# Patient Record
Sex: Male | Born: 1955 | Race: White | Hispanic: No | Smoking: Former smoker
Health system: Southern US, Community
[De-identification: ages and names within clinical notes are randomized; demographics above are authoritative.]

## PROBLEM LIST (undated history)

## (undated) DIAGNOSIS — I2510 Atherosclerotic heart disease of native coronary artery without angina pectoris: Secondary | ICD-10-CM

## (undated) DIAGNOSIS — I358 Other nonrheumatic aortic valve disorders: Secondary | ICD-10-CM

## (undated) DIAGNOSIS — Z87891 Personal history of nicotine dependence: Secondary | ICD-10-CM

## (undated) DIAGNOSIS — B1920 Unspecified viral hepatitis C without hepatic coma: Secondary | ICD-10-CM

## (undated) DIAGNOSIS — F419 Anxiety disorder, unspecified: Secondary | ICD-10-CM

## (undated) DIAGNOSIS — D013 Carcinoma in situ of anus and anal canal: Secondary | ICD-10-CM

## (undated) DIAGNOSIS — I10 Essential (primary) hypertension: Secondary | ICD-10-CM

## (undated) DIAGNOSIS — K6282 Dysplasia of anus: Secondary | ICD-10-CM

## (undated) DIAGNOSIS — M1990 Unspecified osteoarthritis, unspecified site: Secondary | ICD-10-CM

## (undated) DIAGNOSIS — Z21 Asymptomatic human immunodeficiency virus [HIV] infection status: Secondary | ICD-10-CM

## (undated) DIAGNOSIS — B20 Human immunodeficiency virus [HIV] disease: Secondary | ICD-10-CM

## (undated) DIAGNOSIS — L409 Psoriasis, unspecified: Secondary | ICD-10-CM

## (undated) DIAGNOSIS — N433 Hydrocele, unspecified: Secondary | ICD-10-CM

## (undated) HISTORY — DX: Hydrocele, unspecified: N43.3

## (undated) HISTORY — DX: Atherosclerotic heart disease of native coronary artery without angina pectoris: I25.10

## (undated) HISTORY — DX: Essential (primary) hypertension: I10

## (undated) HISTORY — DX: Dysplasia of anus: K62.82

## (undated) HISTORY — DX: Human immunodeficiency virus (HIV) disease: B20

## (undated) HISTORY — DX: Asymptomatic human immunodeficiency virus (hiv) infection status: Z21

## (undated) HISTORY — DX: Carcinoma in situ of anus and anal canal: D01.3

## (undated) HISTORY — DX: Anxiety disorder, unspecified: F41.9

## (undated) HISTORY — PX: TESTICULAR EXPLORATION: SHX5145

## (undated) HISTORY — DX: Personal history of nicotine dependence: Z87.891

## (undated) HISTORY — DX: Other nonrheumatic aortic valve disorders: I35.8

## (undated) HISTORY — DX: Unspecified viral hepatitis C without hepatic coma: B19.20

## (undated) HISTORY — DX: Psoriasis, unspecified: L40.9

## (undated) HISTORY — DX: Unspecified osteoarthritis, unspecified site: M19.90

## (undated) HISTORY — PX: CARDIAC CATHETERIZATION: SHX172

---
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
## 1999-12-08 DIAGNOSIS — I2510 Atherosclerotic heart disease of native coronary artery without angina pectoris: Secondary | ICD-10-CM

## 1999-12-08 HISTORY — DX: Atherosclerotic heart disease of native coronary artery without angina pectoris: I25.10

## 2000-08-21 ENCOUNTER — Encounter: Payer: Self-pay | Admitting: Emergency Medicine

## 2000-08-21 ENCOUNTER — Inpatient Hospital Stay (HOSPITAL_COMMUNITY): Admission: EM | Admit: 2000-08-21 | Discharge: 2000-08-23 | Payer: Self-pay | Admitting: Emergency Medicine

## 2000-08-22 ENCOUNTER — Encounter: Payer: Self-pay | Admitting: Cardiovascular Disease

## 2001-05-29 ENCOUNTER — Emergency Department (HOSPITAL_COMMUNITY): Admission: EM | Admit: 2001-05-29 | Discharge: 2001-05-29 | Payer: Self-pay | Admitting: Emergency Medicine

## 2001-09-16 ENCOUNTER — Ambulatory Visit (HOSPITAL_COMMUNITY): Admission: RE | Admit: 2001-09-16 | Discharge: 2001-09-16 | Payer: Self-pay | Admitting: Gastroenterology

## 2003-03-15 ENCOUNTER — Encounter: Payer: Self-pay | Admitting: Emergency Medicine

## 2003-03-15 ENCOUNTER — Emergency Department (HOSPITAL_COMMUNITY): Admission: EM | Admit: 2003-03-15 | Discharge: 2003-03-15 | Payer: Self-pay | Admitting: Emergency Medicine

## 2005-08-12 ENCOUNTER — Inpatient Hospital Stay (HOSPITAL_COMMUNITY): Admission: AD | Admit: 2005-08-12 | Discharge: 2005-08-15 | Payer: Self-pay | Admitting: Orthopaedic Surgery

## 2005-08-12 ENCOUNTER — Ambulatory Visit: Payer: Self-pay | Admitting: Infectious Diseases

## 2012-08-07 DIAGNOSIS — I358 Other nonrheumatic aortic valve disorders: Secondary | ICD-10-CM

## 2012-08-07 HISTORY — DX: Other nonrheumatic aortic valve disorders: I35.8

## 2013-07-04 ENCOUNTER — Other Ambulatory Visit: Payer: Self-pay | Admitting: *Deleted

## 2013-07-04 MED ORDER — METOPROLOL SUCCINATE ER 25 MG PO TB24: 25.0000 mg | ORAL_TABLET | Freq: Every day | ORAL | Status: DC

## 2013-07-04 NOTE — Telephone Encounter (Signed)
Rx was sent to pharmacy electronically. 

## 2013-07-24 ENCOUNTER — Telehealth: Payer: Self-pay | Admitting: Cardiovascular Disease

## 2013-07-24 MED ORDER — HYDROCHLOROTHIAZIDE 25 MG PO TABS: 12.5000 mg | ORAL_TABLET | Freq: Every day | ORAL | Status: DC

## 2013-07-24 NOTE — Telephone Encounter (Signed)
Returned call.  No answer.  No message left r/t a business voicemail.  Will refill one 90-day supply with (0) zero refills.  Pt must keep appt on 9.30.14.

## 2013-07-24 NOTE — Telephone Encounter (Signed)
Patient needs new rx called to [facility] [phone] for HCTZ 25mg  # 90  1/2 tab daily.

## 2013-08-18 ENCOUNTER — Ambulatory Visit: Payer: Self-pay | Admitting: Cardiovascular Disease

## 2013-08-31 ENCOUNTER — Encounter: Payer: Self-pay | Admitting: Cardiology

## 2013-08-31 DIAGNOSIS — I2510 Atherosclerotic heart disease of native coronary artery without angina pectoris: Secondary | ICD-10-CM

## 2013-08-31 DIAGNOSIS — I358 Other nonrheumatic aortic valve disorders: Secondary | ICD-10-CM | POA: Insufficient documentation

## 2013-08-31 DIAGNOSIS — Z87891 Personal history of nicotine dependence: Secondary | ICD-10-CM | POA: Insufficient documentation

## 2013-08-31 DIAGNOSIS — I10 Essential (primary) hypertension: Secondary | ICD-10-CM | POA: Insufficient documentation

## 2013-09-04 ENCOUNTER — Encounter: Payer: Self-pay | Admitting: Cardiovascular Disease

## 2013-09-05 ENCOUNTER — Encounter: Payer: Self-pay | Admitting: Cardiovascular Disease

## 2013-09-05 ENCOUNTER — Ambulatory Visit (INDEPENDENT_AMBULATORY_CARE_PROVIDER_SITE_OTHER): Payer: BC Managed Care – PPO | Admitting: Cardiovascular Disease

## 2013-09-05 VITALS — BP 112/80 | HR 61 | Ht 73.0 in | Wt 203.2 lb

## 2013-09-05 DIAGNOSIS — I2510 Atherosclerotic heart disease of native coronary artery without angina pectoris: Secondary | ICD-10-CM

## 2013-09-05 DIAGNOSIS — I10 Essential (primary) hypertension: Secondary | ICD-10-CM

## 2013-09-05 NOTE — Progress Notes (Signed)
Patient ID: Ronald Avery, male   DOB: 05-02-1956, 57 y.o.   MRN: [id number]     HPI: Ronald Avery, is a 57 y.o. male who presents for one-year cardiology evaluation.  Ronald Avery has a history of hypertension. I first saw him in 2001 when he presented with stage II hypertension. He has been on combination therapy since that time. Because of recurrent episodes of chest tightness worrisome for angina I did perform cardiac catheterization on 08/23/2000 which revealed 30-40% mid RCA narrowing and 20% ostial stenosis involving the PDA branch of the right artery artery. He has been treated medically. His last nuclear perfusion study was in August 2008 which continued to show normal perfusion.  He has been on combination therapy for hypertension. He has had mildly elevated cholesterol levels in the in the past but these have significantly improved with aggressive diet and exercise.  Over the past year, he lost his job. He is now back in school getting advanced training he presents for one-year followup evaluation.  Past Medical History  Diagnosis Date  . HTN (hypertension)   . CAD (coronary artery disease) 2001    minor- 30-40% RCA  . History of smoking   . Aortic valve sclerosis 9/13    Past Surgical History  Procedure Laterality Date  . Knee arthroscopy  1973  . Testicular exploration  1882    cyst    No Known Allergies  Current Outpatient Prescriptions  Medication Sig Dispense Refill  . amLODipine (NORVASC) 5 MG tablet Take 5 mg by mouth daily.      Marland Kitchen aspirin 81 MG tablet Take 81 mg by mouth daily.      . hydrochlorothiazide (HYDRODIURIL) 25 MG tablet Take 0.5 tablets (12.5 mg total) by mouth daily. Please keep appt for refills  45 tablet  0  . metoprolol succinate (TOPROL XL) 25 MG 24 hr tablet Take 1 tablet (25 mg total) by mouth daily.  90 tablet  0  . ramipril (ALTACE) 10 MG capsule Take 10 mg by mouth daily.       No current facility-administered medications for  this visit.    History   Social History  . Marital Status: Divorced    Spouse Name: N/A    Number of Children: N/A  . Years of Education: N/A   Occupational History  . Not on file.   Social History Main Topics  . Smoking status: Former Games developer  . Smokeless tobacco: Not on file  . Alcohol Use: No  . Drug Use: Not on file  . Sexual Activity: Not on file   Other Topics Concern  . Not on file   Social History Narrative  . No narrative on file    Family History  Problem Relation Age of Onset  . Cancer Mother 12    Socially he is divorced and has 2 children. Remote tobacco history but he quit many years ago. Does drink occasional alcohol. He does exercise.   ROS is negative for fevers, chills or night sweats. He denies visual symptoms. He denies palpitations. He denies chest pressure. He denies wheezing. Denies abdominal pain. There is no bleeding. He denies claudication. There is no edema.  Other system review is negative.  PE BP 112/80  Pulse 61  Ht 6\' 1"  (1.854 m)  Wt 203 lb 3.2 oz (92.171 kg)  BMI 26.81 kg/m2  Repeat blood pressure 128/78. General: Alert, oriented, no distress.  Skin: normal turgor, no rashes HEENT: Normocephalic, atraumatic. Pupils round and  reactive; sclera anicteric;no lid lag.  Nose without nasal septal hypertrophy Mouth/Parynx benign; Mallinpatti scale 2 Neck: No JVD, no carotid briuts Lungs: clear to ausculatation and percussion; no wheezing or rales Heart: RRR, s1 s2 normal  No S3 gallop; no rub. Whiff of a systolic murmur Abdomen: soft, nontender; no hepatosplenomehaly, BS+; abdominal aorta nontender and not dilated by palpation. Pulses 2+ Extremities: no clubbing cyanosis or edema, Homan's sign negative  Neurologic: grossly nonfocal  ECG: Normal sinus rhythm at 61 beats per minute. Very mild first-degree AV block with PR 208 ms. Normal QTc interval.  LABS:  BMET No results found for this basename: na, k, cl, co2, glucose, bun,  creatinine, calcium, gfrnonaa, gfraa     Hepatic Function Panel  No results found for this basename: prot, albumin, ast, alt, alkphos, bilitot, bilidir, ibili     CBC No results found for this basename: wbc, rbc, hgb, hct, plt, mcv, mch, mchc, rdw, neutrabs, lymphsabs, monoabs, eosabs, basosabs     BNP No results found for this basename: probnp    Lipid Panel  No results found for this basename: chol, trig, hdl, cholhdl, vldl, ldlcalc     RADIOLOGY: No results found.    ASSESSMENT AND PLAN: Clinically, [name] continues to do well. He was wondering some of his medications can be reduced. Presently, he has no signs of edema. I recommended we discontinue the hydrochlorothiazide. He'll monitor his blood pressure with goal ideally to be in the 120s systolically. His blood pressure consistently gets above 140 this may need to be reinstituted. He tells me he recently had lab work done several months ago his previous work. We will try to get the results from these from lab core. Adjustments were made as necessary to medical regimen per I did encourage continued exercise. Body mass index today is 26.8. His rhythm is stable. His blood pressure is well controlled. I will see him in one year for followup cardiology evaluation or sooner if problems arise.     Lennette Bihari, MD, [facility]  09/05/2013 9:16 AM

## 2013-09-05 NOTE — Patient Instructions (Addendum)
Your physician recommends that you schedule a follow-up appointment in 1 YEAR.  

## 2013-11-01 ENCOUNTER — Telehealth: Payer: Self-pay | Admitting: Cardiovascular Disease

## 2013-11-01 MED ORDER — METOPROLOL SUCCINATE ER 25 MG PO TB24: 25.0000 mg | ORAL_TABLET | Freq: Every day | ORAL | Status: DC

## 2013-11-01 MED ORDER — AMLODIPINE BESYLATE 5 MG PO TABS: 5.0000 mg | ORAL_TABLET | Freq: Every day | ORAL | Status: DC

## 2013-11-01 MED ORDER — RAMIPRIL 10 MG PO CAPS: 10.0000 mg | ORAL_CAPSULE | Freq: Every day | ORAL | Status: DC

## 2013-11-01 NOTE — Telephone Encounter (Signed)
Returned patient's call. Informed samples of requested medications not available. Patient in between insurance companies and would like a 7 day supply sent to local pharmacy. Medications refilled at [facility] per patient.

## 2013-11-01 NOTE — Telephone Encounter (Signed)
Would like to know if we have samples of Toprol,Ramipril and Amlodipine please.

## 2013-11-08 ENCOUNTER — Other Ambulatory Visit: Payer: Self-pay | Admitting: *Deleted

## 2013-11-08 MED ORDER — METOPROLOL SUCCINATE ER 25 MG PO TB24: 25.0000 mg | ORAL_TABLET | Freq: Every day | ORAL | Status: DC

## 2013-11-08 NOTE — Telephone Encounter (Signed)
Rx was sent to pharmacy electronically. 

## 2014-02-20 ENCOUNTER — Telehealth: Payer: Self-pay | Admitting: Cardiovascular Disease

## 2014-02-20 MED ORDER — AMLODIPINE BESYLATE 5 MG PO TABS: 5.0000 mg | ORAL_TABLET | Freq: Every day | ORAL | Status: DC

## 2014-02-20 MED ORDER — RAMIPRIL 10 MG PO CAPS: 10.0000 mg | ORAL_CAPSULE | Freq: Every day | ORAL | Status: DC

## 2014-02-20 NOTE — Telephone Encounter (Signed)
Refills sent to pharmacy. 

## 2014-02-20 NOTE — Telephone Encounter (Signed)
He need new prescriptions for a new pharmacy. He need Ramapril 10 mg #90 and Amlodipine 5 mg #90. Please call to [facility]-[phone].

## 2014-09-06 ENCOUNTER — Telehealth: Payer: Self-pay | Admitting: Cardiovascular Disease

## 2014-09-06 MED ORDER — AMLODIPINE BESYLATE 5 MG PO TABS: 5.0000 mg | ORAL_TABLET | Freq: Every day | ORAL | Status: DC

## 2014-09-06 NOTE — Telephone Encounter (Signed)
Rx was sent to pharmacy electronically. 

## 2014-09-06 NOTE — Telephone Encounter (Signed)
Pt need a new prescription for his Amlodipine 5 mg #90 please. Please call to [facility].

## 2014-09-24 ENCOUNTER — Ambulatory Visit (INDEPENDENT_AMBULATORY_CARE_PROVIDER_SITE_OTHER): Payer: BC Managed Care – PPO | Admitting: Cardiovascular Disease

## 2014-09-24 ENCOUNTER — Encounter: Payer: Self-pay | Admitting: Cardiovascular Disease

## 2014-09-24 VITALS — BP 172/98 | HR 73 | Ht 73.0 in | Wt 197.8 lb

## 2014-09-24 DIAGNOSIS — I359 Nonrheumatic aortic valve disorder, unspecified: Secondary | ICD-10-CM

## 2014-09-24 DIAGNOSIS — I2510 Atherosclerotic heart disease of native coronary artery without angina pectoris: Secondary | ICD-10-CM

## 2014-09-24 DIAGNOSIS — R5383 Other fatigue: Secondary | ICD-10-CM

## 2014-09-24 DIAGNOSIS — I10 Essential (primary) hypertension: Secondary | ICD-10-CM

## 2014-09-24 DIAGNOSIS — I358 Other nonrheumatic aortic valve disorders: Secondary | ICD-10-CM

## 2014-09-24 DIAGNOSIS — B20 Human immunodeficiency virus [HIV] disease: Secondary | ICD-10-CM | POA: Insufficient documentation

## 2014-09-24 MED ORDER — AMLODIPINE BESYLATE 10 MG PO TABS: 10.0000 mg | ORAL_TABLET | Freq: Every day | ORAL | Status: DC

## 2014-09-24 MED ORDER — LISINOPRIL 5 MG PO TABS: 5.0000 mg | ORAL_TABLET | Freq: Every day | ORAL | Status: DC

## 2014-09-24 MED ORDER — LISINOPRIL 10 MG PO TABS: 10.0000 mg | ORAL_TABLET | Freq: Every day | ORAL | Status: DC

## 2014-09-24 NOTE — Patient Instructions (Signed)
Your physician has recommended you make the following change in your medication: increase the amlodipine to 10 mg daily. Start new prescription for lisinopril as directed on the bottle. These prescriptions have already been sent to the pharmacy.  Your physician recommends that you return for lab work fasting.  Your physician has requested that you have an echocardiogram. Echocardiography is a painless test that uses sound waves to create images of your heart. It provides your doctor with information about the size and shape of your heart and how well your heart's chambers and valves are working. This procedure takes approximately one hour. There are no restrictions for this procedure.  Your physician recommends that you schedule a follow-up appointment in: 4-6 weeks.

## 2014-09-24 NOTE — Progress Notes (Signed)
Patient ID: Ronald Avery, male   DOB: [birth year], 58 y.o.   MRN: [id number]     HPI: Ronald Avery is a 58 y.o. male who presents for one-year cardiology evaluation.  Ronald Avery has a history of hypertension. I first saw him in 2001 when he presented with stage II hypertension. He has been on combination therapy since that time. Because of recurrent episodes of chest tightness worrisome for angina I  performed cardiac catheterization on 08/23/2000 which revealed 30-40% mid RCA narrowing and 20% ostial stenosis involving the PDA branch of the right artery artery. He has been treated medically. His last nuclear perfusion study was in August 2008 which continued to show normal perfusion.  He has been on combination therapy for hypertension. He has had mildly elevated cholesterol levels in the in the past but these have significantly improved with aggressive diet and exercise.  When I saw him one year ago, he was on amlodipine 5 mg, metoprolol XL 25 mg, Ramapo 10 mg, and hydrochlorothiazide 12.5 mg daily for blood pressure control.  His blood pressure was stable.  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  He tells me that several months ago.  He was more fatigued.  At that time he decided to stop 3 of his blood pressure medications and, consequently, he's only been taking amlodipine 5 mg along with his aspirin and Atripla.  He stopped hydrochlorothiazide, Toprol-XL, and Ramapo.  He is back at school, getting a Masters degree in sociology and still working part-time.  He has not been exercising as regularly as he had in the past.  He presents for evaluation.   Past Medical History  Diagnosis Date  . HTN (hypertension)   . CAD (coronary artery disease) 2001    minor- 30-40% RCA  . History of smoking   . Aortic valve sclerosis 9/13    Past Surgical History  Procedure  Laterality Date  . Knee arthroscopy  1973  . Testicular exploration  1882    cyst    No Known Allergies  Current Outpatient Prescriptions  Medication Sig Dispense Refill  . aspirin 81 MG tablet Take 81 mg by mouth daily.      . ATRIPLA 600-200-300 MG per tablet       . amLODipine (NORVASC) 10 MG tablet Take 1 tablet (10 mg total) by mouth daily.  180 tablet  3  . lisinopril (PRINIVIL,ZESTRIL) 10 MG tablet Take 1 tablet (10 mg total) by mouth daily.  90 tablet  3  . lisinopril (PRINIVIL,ZESTRIL) 5 MG tablet Take 1 tablet (5 mg total) by mouth daily.  7 tablet  0   No current facility-administered medications for this visit.    History   Social History  . Marital Status: Divorced    Spouse Name: N/A    Number of Children: N/A  . Years of Education: N/A   Occupational History  . Not on file.   Social History Main Topics  . Smoking status: Former Research scientist (life sciences)  . Smokeless tobacco: Not on file  . Alcohol Use: No  . Drug Use: Not on file  . Sexual Activity: Not on file   Other Topics Concern  . Not on file   Social History Narrative  . No narrative on file    Family History  Problem Relation Age of Onset  . Cancer Mother 45  Socially he is divorced and has 2 children. Remote tobacco history but he quit many years ago. Does drink occasional alcohol. He does exercise.   ROS General: Negative; No fevers, chills, or night sweats; positive for fatigue HEENT: Negative; No changes in vision or hearing, sinus congestion, difficulty swallowing Pulmonary: Negative; No cough, wheezing, shortness of breath, hemoptysis Cardiovascular: Negative; No chest pain, presyncope, syncope, palpitations GI: Negative; No nausea, vomiting, diarrhea, or abdominal pain GU: Negative; No dysuria, hematuria, or difficulty voiding Musculoskeletal: Negative; no myalgias, joint pain, or weakness Hematologic/Oncology: Positive for HIV; no easy bruising, bleeding Endocrine: Negative; no heat/cold  intolerance; no diabetes Neuro: Negative; no changes in balance, headaches Skin: Negative; No rashes or skin lesions Psychiatric: Negative; No behavioral problems, depression Sleep: Negative; No snoring, daytime sleepiness, hypersomnolence, bruxism, restless legs, hypnogognic hallucinations, no cataplexy Other comprehensive 14 point system review is negative.  PE BP 172/98  Pulse 73  Ht 6\' 1"  (1.854 m)  Wt 197 lb 12.8 oz (89.721 kg)  BMI 26.10 kg/m2  Repeat blood pressure by me was 170/100 General: Alert, oriented, no distress.  Skin: normal turgor, no rashes HEENT: Normocephalic, atraumatic. Pupils round and reactive; sclera anicteric;no lid lag.  Nose without nasal septal hypertrophy Mouth/Parynx benign; Mallinpatti scale 2 Neck: No JVD, no carotid bruits with normal carotid upstroke Lungs: clear to ausculatation and percussion; no wheezing or rales Chest wall: Nontender to palpation Heart: RRR, s1 s2 normal  , with a 2/6 systolic murmur.  No S3 gallop.  No S4 gallop.  No rubs, thrills or heaves. Abdomen: soft, nontender; no hepatosplenomehaly, BS+; abdominal aorta nontender and not dilated by palpation. Back: No CVA tenderness Pulses 2+ Extremities: no clubbing cyanosis or edema, Homan's sign negative  Neurologic: grossly nonfocal Psychological: Normal affect and mood  ECG (independently read by me): Normal sinus rhythm at 73 beats per minute.  Small Q-wave in lead 3.  No significant ST-T changes.  September 2014 ECG: Normal sinus rhythm at 61 beats per minute. Very mild first-degree AV block with PR 208 ms. Normal QTc interval.  LABS:  BMET No results found for this basename: na,  k,  cl,  co2,  glucose,  bun,  creatinine,  calcium,  gfrnonaa,  gfraa     Hepatic Function Panel  No results found for this basename: prot,  albumin,  ast,  alt,  alkphos,  bilitot,  bilidir,  ibili     CBC No results found for this basename: wbc,  rbc,  hgb,  hct,  plt,  mcv,  mch,   mchc,  rdw,  neutrabs,  lymphsabs,  monoabs,  eosabs,  basosabs     BNP No results found for this basename: probnp    Lipid Panel  No results found for this basename: chol,  trig,  hdl,  cholhdl,  vldl,  ldlcalc     RADIOLOGY: No results found.    ASSESSMENT AND PLAN: Ronald Avery is a long-standing history of hypertension and when I first saw him 14 years ago he presented with stage II hypertension.  Over the last several years he has been very stable on a 4 drug regimen, consisting of amlodipine, beta blocker therapy, ACE inhibition, and low-dose diuretic therapy.  A cardiac catheterization for chest pain in 2003, showed mild, nonobstructive CAD.  He remotely had smoked cigarettes but did quit tobacco use.  Over the past year, he has tested positive for HIV and now is on triple drug therapy with Atripla.  He tells me that  he has nondetectable virus.  He is followed at [facility].  He has stopped 3 of his 4 antihypertensive medications and today is again in stage II, hypertension.  Suspect the Toprol, coupled with his HIV may have been responsible for his fatigability.  Presently, I am increasing his amlodipine from 5 mg to 10 mg and will restart ACE inhibitor with lisinopril were he will take 5 mg for the first week and then 10 mg daily. I'm scheduling him for a complete set of blood work consisting of a CBC, chemistry profile, vitamin D level, thyroid function studies and lipid studies.  I'm also scheduling him for an echo Doppler study to assess systolic and diastolic function.  I'll see him back in the office in 4-6 weeks for followup evaluation or sooner if problems arise.   Ronald Sine, MD, [facility]  09/24/2014 5:51 PM

## 2014-09-25 ENCOUNTER — Telehealth (HOSPITAL_COMMUNITY): Payer: Self-pay | Admitting: *Deleted

## 2014-10-03 ENCOUNTER — Telehealth (HOSPITAL_COMMUNITY): Payer: Self-pay | Admitting: *Deleted

## 2014-10-04 ENCOUNTER — Telehealth (HOSPITAL_COMMUNITY): Payer: Self-pay | Admitting: *Deleted

## 2014-10-08 ENCOUNTER — Ambulatory Visit (HOSPITAL_COMMUNITY): Payer: BC Managed Care – PPO

## 2014-10-08 LAB — CBC
HEMATOCRIT: 42.5 % (ref 39.0–52.0)
Hemoglobin: 15 g/dL (ref 13.0–17.0)
MCH: 30.9 pg (ref 26.0–34.0)
MCHC: 35.3 g/dL (ref 30.0–36.0)
MCV: 87.6 fL (ref 78.0–100.0)
Platelets: 244 10*3/uL (ref 150–400)
RBC: 4.85 MIL/uL (ref 4.22–5.81)
RDW: 12.4 % (ref 11.5–15.5)
WBC: 4.7 10*3/uL (ref 4.0–10.5)

## 2014-10-08 LAB — COMPREHENSIVE METABOLIC PANEL
ALK PHOS: 82 U/L (ref 39–117)
ALT: 21 U/L (ref 0–53)
AST: 19 U/L (ref 0–37)
Albumin: 4 g/dL (ref 3.5–5.2)
BILIRUBIN TOTAL: 0.3 mg/dL (ref 0.2–1.2)
BUN: 18 mg/dL (ref 6–23)
CO2: 25 mEq/L (ref 19–32)
CREATININE: 0.79 mg/dL (ref 0.50–1.35)
Calcium: 8.7 mg/dL (ref 8.4–10.5)
Chloride: 104 mEq/L (ref 96–112)
GLUCOSE: 101 mg/dL — AB (ref 70–99)
Potassium: 4.5 mEq/L (ref 3.5–5.3)
Sodium: 139 mEq/L (ref 135–145)
Total Protein: 6.6 g/dL (ref 6.0–8.3)

## 2014-10-08 LAB — LIPID PANEL
Cholesterol: 184 mg/dL (ref 0–200)
HDL: 61 mg/dL (ref >39)
LDL Cholesterol: 109 mg/dL — ABNORMAL HIGH (ref 0–99)
Total CHOL/HDL Ratio: 3 Ratio
Triglycerides: 72 mg/dL (ref <150)
VLDL: 14 mg/dL (ref 0–40)

## 2014-10-08 LAB — TSH: TSH: 2.978 u[IU]/mL (ref 0.350–4.500)

## 2014-10-10 LAB — VITAMIN D 1,25 DIHYDROXY
VITAMIN D3 1, 25 (OH): 40 pg/mL
Vitamin D 1, 25 (OH)2 Total: 40 pg/mL (ref 18–72)

## 2014-10-12 ENCOUNTER — Encounter: Payer: Self-pay | Admitting: *Deleted

## 2014-10-19 ENCOUNTER — Telehealth (HOSPITAL_COMMUNITY): Payer: Self-pay | Admitting: *Deleted

## 2014-12-25 ENCOUNTER — Telehealth: Payer: Self-pay | Admitting: Cardiovascular Disease

## 2014-12-25 MED ORDER — LISINOPRIL 10 MG PO TABS: 10.0000 mg | ORAL_TABLET | Freq: Every day | ORAL | Status: DC

## 2014-12-25 NOTE — Telephone Encounter (Signed)
Pt need a new prescription for his Lisinopril #90 and refills please. His insurance changed. Please send this to [facility].

## 2014-12-25 NOTE — Telephone Encounter (Addendum)
Refill submitted to patient's preferred pharmacy. Informed patient. Pt voiced understanding, no other stated concerns at this time.  

## 2015-01-21 ENCOUNTER — Other Ambulatory Visit: Payer: Self-pay | Admitting: Cardiovascular Disease

## 2015-01-21 MED ORDER — AMLODIPINE BESYLATE 10 MG PO TABS: 10.0000 mg | ORAL_TABLET | Freq: Every day | ORAL | Status: DC

## 2015-01-21 NOTE — Telephone Encounter (Signed)
°  1. Which medications need to be refilled? Amlodipine-new prescription  2. Which pharmacy is medication to be sent to?[facility]  3. Do they need a 30 day or 90 day supply? 90 and refills  4. Would they like a call back once the medication has been sent to the pharmacy? yes

## 2015-01-21 NOTE — Telephone Encounter (Signed)
Rx refill sent to patient pharmacy. Patient notified  

## 2015-04-19 ENCOUNTER — Other Ambulatory Visit: Payer: Self-pay | Admitting: Cardiovascular Disease

## 2015-04-19 NOTE — Telephone Encounter (Signed)
Rx(s) sent to pharmacy electronically.  

## 2015-09-10 ENCOUNTER — Other Ambulatory Visit: Payer: Self-pay | Admitting: Cardiovascular Disease

## 2015-09-11 NOTE — Telephone Encounter (Signed)
REFILL 

## 2015-11-28 ENCOUNTER — Other Ambulatory Visit: Payer: Self-pay | Admitting: Cardiovascular Disease

## 2015-11-28 NOTE — Telephone Encounter (Signed)
REFILL 

## 2016-03-12 ENCOUNTER — Other Ambulatory Visit: Payer: Self-pay | Admitting: Cardiovascular Disease

## 2016-04-17 ENCOUNTER — Other Ambulatory Visit: Payer: Self-pay | Admitting: Cardiovascular Disease

## 2016-04-17 NOTE — Telephone Encounter (Signed)
Rx(s) sent to pharmacy electronically.  

## 2016-04-18 ENCOUNTER — Other Ambulatory Visit: Payer: Self-pay | Admitting: Cardiovascular Disease

## 2016-04-20 NOTE — Telephone Encounter (Signed)
Rx request sent to pharmacy.  

## 2016-05-31 ENCOUNTER — Other Ambulatory Visit: Payer: Self-pay | Admitting: Cardiovascular Disease

## 2016-06-15 ENCOUNTER — Other Ambulatory Visit: Payer: Self-pay | Admitting: Cardiovascular Disease

## 2016-06-15 NOTE — Telephone Encounter (Signed)
lisinopril (PRINIVIL,ZESTRIL) 10 MG tablet  Medication   Date: 04/17/2016  Department: [facility]  Ordering/Authorizing: Troy Sine, MD      Order Providers    Prescribing Provider Encounter Provider   Troy Sine, MD Lorretta Harp, MD    Medication Detail      Disp Refills Start End     lisinopril (PRINIVIL,ZESTRIL) 10 MG tablet 15 tablet 0 04/17/2016     Sig - Route: Take 1 tablet (10 mg total) by mouth daily. <PLEASE MAKE APPOINTMENT FOR REFILLS> - Oral    Notes to Pharmacy: Cannot refill for a 90 day supply. Patient has not seen cardiologist since 2015    E-Prescribing Status: Receipt confirmed by pharmacy (04/17/2016 10:06 AM EDT)     Pharmacy    [facility], [place]

## 2016-06-22 ENCOUNTER — Other Ambulatory Visit: Payer: Self-pay | Admitting: Cardiovascular Disease

## 2016-06-29 ENCOUNTER — Telehealth: Payer: Self-pay | Admitting: *Deleted

## 2016-06-29 MED ORDER — AMLODIPINE BESYLATE 10 MG PO TABS: 10.0000 mg | ORAL_TABLET | Freq: Every day | ORAL | 0 refills | Status: DC

## 2016-06-29 MED ORDER — LISINOPRIL 10 MG PO TABS
10.0000 mg | ORAL_TABLET | Freq: Every day | ORAL | 0 refills | Status: DC
Start: 2016-06-29 — End: 2016-09-18

## 2016-06-29 NOTE — Telephone Encounter (Signed)
Pt made appt for 09/16/16, sent enough meds to get him to appt.

## 2016-09-16 ENCOUNTER — Encounter: Payer: Self-pay | Admitting: Cardiovascular Disease

## 2016-09-16 ENCOUNTER — Ambulatory Visit (INDEPENDENT_AMBULATORY_CARE_PROVIDER_SITE_OTHER): Payer: 59 | Admitting: Cardiovascular Disease

## 2016-09-16 VITALS — BP 126/74 | HR 65 | Ht 73.0 in | Wt 211.0 lb

## 2016-09-16 DIAGNOSIS — I2510 Atherosclerotic heart disease of native coronary artery without angina pectoris: Secondary | ICD-10-CM

## 2016-09-16 DIAGNOSIS — B20 Human immunodeficiency virus [HIV] disease: Secondary | ICD-10-CM | POA: Diagnosis not present

## 2016-09-16 DIAGNOSIS — Z79899 Other long term (current) drug therapy: Secondary | ICD-10-CM | POA: Diagnosis not present

## 2016-09-16 DIAGNOSIS — I10 Essential (primary) hypertension: Secondary | ICD-10-CM

## 2016-09-16 NOTE — Patient Instructions (Signed)
Your physician recommends that you return for lab work fasting.   Your physician wants you to follow-up in: 1 year or sooner if needed. You will receive a reminder letter in the mail two months in advance. If you don't receive a letter, please call our office to schedule the follow-up appointment.  If you need a refill on your cardiac medications before your next appointment, please call your pharmacy.    

## 2016-09-16 NOTE — Progress Notes (Signed)
Patient ID: Ronald Avery, male   DOB: 11-06-1956, 60 y.o.   MRN: [id number]     HPI: Ronald Avery is a 60 y.o. male who presents for a 2 year cardiology evaluation.  Mr. Steinborn has a history of hypertension. I first saw him in 2001 when he presented with stage II hypertension. He has been on combination therapy since that time. Because of recurrent episodes of chest tightness worrisome for angina I  performed cardiac catheterization on 08/23/2000 which revealed 30-40% mid RCA narrowing and 20% ostial stenosis involving the PDA branch of the right artery artery. He has been treated medically. His last nuclear perfusion study was in August 2008 which continued to show normal perfusion.  He has been on combination therapy for hypertension. He has had mildly elevated cholesterol levels in the in the past but these have significantly improved with aggressive diet and exercise.  When I saw him in 2014, he was on amlodipine 5 mg, metoprolol XL 25 mg, Ramipril 10 mg, and hydrochlorothiazide 12.5 mg daily for blood pressure control.  His blood pressure was stable.  He tested positive for HIV and when l saw him in 2015 hewas on triple drug therapy and is followed in infectious disease clinic at [facility].  In 2015 he was more fatigued  He was more fatigued and he decided to stop 3 of his blood pressure medications and, consequently, he's only been taking amlodipine 5 mg along with his aspirin and Atripla.  He stopped hydrochlorothiazide, Toprol-XL, and Ramipril.  When I saw him in the office, he had stage II hypertension despite taking amlodipine 5 mg and I titrated this to 10 mg and started him back on lisinopril.  Over the past 2 years, he completed a master's degree in social work.  He graduated in May.  He now has more time and is back exercising.  He is running 10 miles per week.  He denies chest pain, PND, orthopnea.  ith reference HIV, he is now on a 2 drug regimen.  He presents for 2  year evaluation  Past Medical History:  Diagnosis Date  . 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 CAD (coronary artery disease) 2001   minor- 30-40% RCA  . History of smoking   . HTN (hypertension)     Past Surgical History:  Procedure Laterality Date  . KNEE ARTHROSCOPY  1973  . TESTICULAR EXPLORATION  1882   cyst    No Known Allergies  Current Outpatient Prescriptions  Medication Sig Dispense Refill  . amLODipine (NORVASC) 10 MG tablet Take 1 tablet (10 mg total) by mouth daily. 90 tablet 0  . aspirin 81 MG tablet Take 81 mg by mouth daily.    . DESCOVY 200-25 MG tablet Take 1 tablet by mouth daily.    . dolutegravir (TIVICAY) 50 MG tablet Take 50 mg by mouth daily.    Marland Kitchen lisinopril (PRINIVIL,ZESTRIL) 10 MG tablet Take 1 tablet (10 mg total) by mouth daily. 90 tablet 0   No current facility-administered medications for this visit.     Social History   Social History  . Marital status: Divorced    Spouse name: N/A  . Number of children: N/A  . Years of education: N/A   Occupational History  . Not on file.   Social History Main Topics  . Smoking status: Former Research scientist (life sciences)  . Smokeless tobacco: Not on file  . Alcohol use No  . Drug use: Unknown  . Sexual activity: Not on  file   Other Topics Concern  . Not on file   Social History Narrative  . No narrative on file    Family History  Problem Relation Age of Onset  . Cancer Mother 60   Socially he is divorced and has 2 children. Remote tobacco history but he quit many years ago. Does drink occasional alcohol. He does exercise.   ROS General: Negative; No fevers, chills, or night sweats; positive for fatigue HEENT: Negative; No changes in vision or hearing, sinus congestion, difficulty swallowing Pulmonary: Negative; No cough, wheezing, shortness of breath, hemoptysis Cardiovascular: Negative; No chest pain, presyncope, syncope, palpitations GI: Negative; No nausea, vomiting, diarrhea, or abdominal pain GU: Negative;  No dysuria, hematuria, or difficulty voiding Musculoskeletal: Negative; no myalgias, joint pain, or weakness Hematologic/Oncology: Positive for HIV; no easy bruising, bleeding Endocrine: Negative; no heat/cold intolerance; no diabetes Neuro: Negative; no changes in balance, headaches Skin: Negative; No rashes or skin lesions Psychiatric: Negative; No behavioral problems, depression Sleep: Negative; No snoring, daytime sleepiness, hypersomnolence, bruxism, restless legs, hypnogognic hallucinations, no cataplexy Other comprehensive 14 point system review is negative.  PE BP 126/74   Pulse 65   Ht '6\' 1"'  (1.854 m)   Wt 211 lb (95.7 kg)   BMI 27.84 kg/m   Repeat blood pressure by me was 128/78  Wt Readings from Last 3 Encounters:  09/16/16 211 lb (95.7 kg)  09/24/14 197 lb 12.8 oz (89.7 kg)  09/05/13 203 lb 3.2 oz (92.2 kg)   General: Alert, oriented, no distress.  Skin: normal turgor, no rashes HEENT: Normocephalic, atraumatic. Pupils round and reactive; sclera anicteric;no lid lag.  Nose without nasal septal hypertrophy Mouth/Parynx benign; Mallinpatti scale 2 Neck: No JVD, no carotid bruits with normal carotid upstroke Lungs: clear to ausculatation and percussion; no wheezing or rales Chest wall: Nontender to palpation Heart: RRR, s1 s2 normal  , with a 2/6 systolic murmur.  No S3 gallop.  No S4 gallop.  No rubs, thrills or heaves. Abdomen: soft, nontender; no hepatosplenomehaly, BS+; abdominal aorta nontender and not dilated by palpation. Back: No CVA tenderness Pulses 2+ Extremities: no clubbing cyanosis or edema, Homan's sign negative  Neurologic: grossly nonfocal Psychological: Normal affect and mood  ECG (independently read by me): Normal sinus rhythm at 65 bpm.  First degree AV block with a PR interval at 216 ms.  October 2015 ECG (independently read by me): Normal sinus rhythm at 73 beats per minute.  Small Q-wave in lead 3.  No significant ST-T changes.  September  2014 ECG: Normal sinus rhythm at 61 beats per minute. Very mild first-degree AV block with PR 208 ms. Normal QTc interval.  LABS: BMP Latest Ref Rng & Units 10/08/2014  Glucose 70 - 99 mg/dL 101(H)  BUN 6 - 23 mg/dL 18  Creatinine 0.50 - 1.35 mg/dL 0.79  Sodium 135 - 145 mEq/L 139  Potassium 3.5 - 5.3 mEq/L 4.5  Chloride 96 - 112 mEq/L 104  CO2 19 - 32 mEq/L 25  Calcium 8.4 - 10.5 mg/dL 8.7   Hepatic Function Latest Ref Rng & Units 10/08/2014  Total Protein 6.0 - 8.3 g/dL 6.6  Albumin 3.5 - 5.2 g/dL 4.0  AST 0 - 37 U/L 19  ALT 0 - 53 U/L 21  Alk Phosphatase 39 - 117 U/L 82  Total Bilirubin 0.2 - 1.2 mg/dL 0.3   CBC Latest Ref Rng & Units 10/08/2014  WBC 4.0 - 10.5 K/uL 4.7  Hemoglobin 13.0 - 17.0 g/dL 15.0  Hematocrit  39.0 - 52.0 % 42.5  Platelets 150 - 400 K/uL 244   Lab Results  Component Value Date   MCV 87.6 10/08/2014   Lab Results  Component Value Date   TSH 2.978 10/08/2014   Lipid Panel     Component Value Date/Time   CHOL 184 10/08/2014 0813   TRIG 72 10/08/2014 0813   HDL 61 10/08/2014 0813   CHOLHDL 3.0 10/08/2014 0813   VLDL 14 10/08/2014 0813   LDLCALC 109 (H) 10/08/2014 0813    ASSESSMENT AND PLAN: Mr. Corrales is a 61 year old Caucasian male who has a long-standing history of hypertension and when I first saw him in 2001 he presented with stage II hypertension.  He was treated initially and stabilized with  a 4 drug regimen, consisting of amlodipine, beta blocker therapy, ACE inhibition, and low-dose diuretic therapy.  A cardiac catheterization for chest pain in 2003, showed mild, nonobstructive CAD.  He remotely had smoked cigarettes but did quit tobacco use. He has tested positive for HIV and by his report this is very stable after being on triple drug therapy for several years and now on 2 drug therapy.  When seen 2 years ago.  He again had stage II hypertension after he did stop 3 of his 4 medications.  Over the past several years.  His blood pressure  is stabilized on amlodipine 10 mg and lisinopril 10 mg.  He now has more time having completed his master's program.  He is exercising regularly.  Blood pressure is stable.  He's not having edema.  There is mild first-degree AV block on ECG, which is old.  He continues to take baby aspirin for antiplatelet benefit.  I have recommended follow-up laboratory in the fasting state.  As long as he is doing well, I will see him in one year for reevaluation or sooner if problems arise.  Troy Sine, MD, [facility]  09/16/2016 10:31 PM

## 2016-09-17 ENCOUNTER — Other Ambulatory Visit: Payer: Self-pay | Admitting: Cardiovascular Disease

## 2016-09-18 ENCOUNTER — Other Ambulatory Visit: Payer: Self-pay

## 2016-09-18 MED ORDER — LISINOPRIL 10 MG PO TABS: 10.0000 mg | ORAL_TABLET | Freq: Every day | ORAL | 3 refills | Status: DC

## 2016-12-07 HISTORY — PX: COLONOSCOPY: SHX174

## 2017-02-04 DIAGNOSIS — I2510 Atherosclerotic heart disease of native coronary artery without angina pectoris: Secondary | ICD-10-CM | POA: Diagnosis not present

## 2017-02-04 DIAGNOSIS — Z1211 Encounter for screening for malignant neoplasm of colon: Secondary | ICD-10-CM | POA: Diagnosis not present

## 2017-02-04 DIAGNOSIS — Z79899 Other long term (current) drug therapy: Secondary | ICD-10-CM | POA: Diagnosis not present

## 2017-02-04 DIAGNOSIS — K5730 Diverticulosis of large intestine without perforation or abscess without bleeding: Secondary | ICD-10-CM | POA: Diagnosis not present

## 2017-02-04 DIAGNOSIS — I10 Essential (primary) hypertension: Secondary | ICD-10-CM | POA: Diagnosis not present

## 2017-02-22 ENCOUNTER — Encounter: Payer: Self-pay | Admitting: Cardiovascular Disease

## 2017-02-22 DIAGNOSIS — K5730 Diverticulosis of large intestine without perforation or abscess without bleeding: Secondary | ICD-10-CM | POA: Diagnosis not present

## 2017-02-22 DIAGNOSIS — D120 Benign neoplasm of cecum: Secondary | ICD-10-CM | POA: Diagnosis not present

## 2017-02-22 DIAGNOSIS — Z1211 Encounter for screening for malignant neoplasm of colon: Secondary | ICD-10-CM | POA: Diagnosis not present

## 2017-02-22 DIAGNOSIS — K635 Polyp of colon: Secondary | ICD-10-CM | POA: Diagnosis not present

## 2017-04-10 ENCOUNTER — Emergency Department (HOSPITAL_COMMUNITY)
Admission: EM | Admit: 2017-04-10 | Discharge: 2017-04-10 | Disposition: A | Discharge status: Home / Self Care | Payer: 59 | Attending: Emergency Medicine | Admitting: Emergency Medicine

## 2017-04-10 ENCOUNTER — Encounter (HOSPITAL_COMMUNITY): Payer: Self-pay | Admitting: Emergency Medicine

## 2017-04-10 ENCOUNTER — Ambulatory Visit (HOSPITAL_COMMUNITY)
Admission: EM | Admit: 2017-04-10 | Discharge: 2017-04-10 | Disposition: A | Discharge status: Home / Self Care | Payer: 59 | Attending: Internal Medicine | Admitting: Internal Medicine

## 2017-04-10 ENCOUNTER — Ambulatory Visit (INDEPENDENT_AMBULATORY_CARE_PROVIDER_SITE_OTHER): Payer: 59

## 2017-04-10 ENCOUNTER — Telehealth: Payer: Self-pay | Admitting: Internal Medicine

## 2017-04-10 DIAGNOSIS — R197 Diarrhea, unspecified: Secondary | ICD-10-CM

## 2017-04-10 DIAGNOSIS — R109 Unspecified abdominal pain: Secondary | ICD-10-CM | POA: Diagnosis not present

## 2017-04-10 DIAGNOSIS — Z79899 Other long term (current) drug therapy: Secondary | ICD-10-CM | POA: Insufficient documentation

## 2017-04-10 DIAGNOSIS — Z87891 Personal history of nicotine dependence: Secondary | ICD-10-CM | POA: Diagnosis not present

## 2017-04-10 DIAGNOSIS — R933 Abnormal findings on diagnostic imaging of other parts of digestive tract: Secondary | ICD-10-CM | POA: Diagnosis not present

## 2017-04-10 DIAGNOSIS — I2510 Atherosclerotic heart disease of native coronary artery without angina pectoris: Secondary | ICD-10-CM | POA: Diagnosis not present

## 2017-04-10 DIAGNOSIS — R1013 Epigastric pain: Secondary | ICD-10-CM | POA: Diagnosis not present

## 2017-04-10 DIAGNOSIS — R1010 Upper abdominal pain, unspecified: Secondary | ICD-10-CM | POA: Diagnosis not present

## 2017-04-10 DIAGNOSIS — Z7982 Long term (current) use of aspirin: Secondary | ICD-10-CM | POA: Insufficient documentation

## 2017-04-10 DIAGNOSIS — E860 Dehydration: Secondary | ICD-10-CM | POA: Diagnosis not present

## 2017-04-10 DIAGNOSIS — I10 Essential (primary) hypertension: Secondary | ICD-10-CM | POA: Diagnosis not present

## 2017-04-10 LAB — LIPASE, BLOOD: Lipase: 21 U/L (ref 11–51)

## 2017-04-10 LAB — POCT URINALYSIS DIP (DEVICE)
GLUCOSE, UA: NEGATIVE mg/dL
KETONES UR: NEGATIVE mg/dL
Leukocytes, UA: NEGATIVE
NITRITE: NEGATIVE
PH: 5.5 (ref 5.0–8.0)
PROTEIN: 30 mg/dL — AB
Specific Gravity, Urine: 1.025 (ref 1.005–1.030)
Urobilinogen, UA: 0.2 mg/dL (ref 0.0–1.0)

## 2017-04-10 LAB — URINALYSIS, ROUTINE W REFLEX MICROSCOPIC
Bilirubin Urine: NEGATIVE
Glucose, UA: NEGATIVE mg/dL
Hgb urine dipstick: NEGATIVE
Ketones, ur: NEGATIVE mg/dL
Leukocytes, UA: NEGATIVE
NITRITE: NEGATIVE
Protein, ur: NEGATIVE mg/dL
SPECIFIC GRAVITY, URINE: 1.031 — AB (ref 1.005–1.030)
pH: 5 (ref 5.0–8.0)

## 2017-04-10 LAB — COMPREHENSIVE METABOLIC PANEL
ALK PHOS: 70 U/L (ref 38–126)
ALT: 30 U/L (ref 17–63)
ANION GAP: 8 (ref 5–15)
AST: 26 U/L (ref 15–41)
Albumin: 4.1 g/dL (ref 3.5–5.0)
BILIRUBIN TOTAL: 0.9 mg/dL (ref 0.3–1.2)
BUN: 15 mg/dL (ref 6–20)
CALCIUM: 8.9 mg/dL (ref 8.9–10.3)
CO2: 26 mmol/L (ref 22–32)
Chloride: 103 mmol/L (ref 101–111)
Creatinine, Ser: 1.08 mg/dL (ref 0.61–1.24)
GFR calc non Af Amer: >60 mL/min (ref >60)
Glucose, Bld: 92 mg/dL (ref 65–99)
Potassium: 3.4 mmol/L — ABNORMAL LOW (ref 3.5–5.1)
SODIUM: 137 mmol/L (ref 135–145)
TOTAL PROTEIN: 7.5 g/dL (ref 6.5–8.1)

## 2017-04-10 LAB — CBC
HCT: 46.1 % (ref 39.0–52.0)
HEMOGLOBIN: 16.3 g/dL (ref 13.0–17.0)
MCH: 30.9 pg (ref 26.0–34.0)
MCHC: 35.4 g/dL (ref 30.0–36.0)
MCV: 87.5 fL (ref 78.0–100.0)
Platelets: 264 10*3/uL (ref 150–400)
RBC: 5.27 MIL/uL (ref 4.22–5.81)
RDW: 12.6 % (ref 11.5–15.5)
WBC: 11.1 10*3/uL — ABNORMAL HIGH (ref 4.0–10.5)

## 2017-04-10 MED ORDER — SODIUM CHLORIDE 0.9 % IV BOLUS (SEPSIS)
2000.0000 mL | Freq: Once | INTRAVENOUS | Status: AC
  Administered 2017-04-10: 2000 mL via INTRAVENOUS

## 2017-04-10 NOTE — ED Triage Notes (Signed)
Here for intermittent epigastric pain onset 5 days associated w/belching  sts nothing worsens the pain and was eating yogurt w/temp relief  Reports sx began after eating bad food and started to have diarrhea and vomiting  Denies fevers  A&O x4... NAD

## 2017-04-10 NOTE — ED Notes (Signed)
Comfort measures provided.  Updated on wait.

## 2017-04-10 NOTE — ED Provider Notes (Signed)
CSN: [id number]     Arrival date & time 04/10/17  1248 History   None    Chief Complaint  Patient presents with  . Abdominal Pain   (Consider location/radiation/quality/duration/timing/severity/associated sxs/prior Treatment) 61 year old male with epigastric pain for 4 days. Evening before he had nausea. That has not had vomiting. He states the pain will radiate inferiorly along the midline but not to the right or left. The epigastric pain will last for just a few minutes and then will spontaneously abate. It occur several times during the day. Also complaining of watery diarrhea, no blood. He started taking Imodium last evening and today. He has had 4 watery stools yesterday and 3 today. He states that he had been having diarrhea for up to 10 times per day. Currently he has no abdominal pain or tenderness but did have epigastric pain while waiting in the lobby.      Past Medical History:  Diagnosis Date  . 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 CAD (coronary artery disease) 2001   minor- 30-40% RCA  . History of smoking   . HTN (hypertension)    Past Surgical History:  Procedure Laterality Date  . KNEE ARTHROSCOPY  1973  . TESTICULAR EXPLORATION  1882   cyst   Family History  Problem Relation Age of Onset  . Cancer Mother 63   Social History  Substance Use Topics  . Smoking status: Former Research scientist (life sciences)  . Smokeless tobacco: Never Used  . Alcohol use No    Review of Systems  Constitutional: Positive for appetite change and chills. Negative for fever.  HENT: Negative.   Respiratory: Negative.   Cardiovascular: Negative.   Gastrointestinal: Positive for abdominal pain, diarrhea and nausea. Negative for blood in stool, constipation and vomiting.  Genitourinary: Negative.   Musculoskeletal: Negative.   Skin: Negative for color change.  Neurological: Negative.   All other systems reviewed and are negative.   Allergies  Patient has no known allergies.  Home Medications   Prior to  Admission medications   Medication Sig Start Date End Date Taking? Authorizing Provider  amLODipine (NORVASC) 10 MG tablet TAKE 1 TABLET BY MOUTH  DAILY 09/18/16  Yes Troy Sine, MD  aspirin 81 MG tablet Take 81 mg by mouth daily.   Yes [provider]  DESCOVY 200-25 MG tablet Take 1 tablet by mouth daily. 09/03/16  Yes [provider]  dolutegravir (TIVICAY) 50 MG tablet Take 50 mg by mouth daily. 02/19/16  Yes [provider]  lisinopril (PRINIVIL,ZESTRIL) 10 MG tablet Take 1 tablet (10 mg total) by mouth daily. 09/18/16  Yes Troy Sine, MD   Meds Ordered and Administered this Visit  Medications - No data to display  BP 114/73 (BP Location: Right Arm)   Pulse 78   Temp 98.7 F (37.1 C) (Oral)   Resp 16   SpO2 97%  No data found.   Physical Exam  Constitutional: He appears well-developed and well-nourished. No distress.  Eyes: EOM are normal.  Neck: Neck supple.  Cardiovascular: Normal rate, regular rhythm, normal heart sounds and intact distal pulses.   Pulmonary/Chest: Effort normal and breath sounds normal. No respiratory distress. He has no wheezes. He has no rales.  Abdominal: Soft. Bowel sounds are normal. There is no rebound and no guarding.  Bowel sounds and hyperactive in all quadrants. Epigastrium and left upper quadrant tympanic. Left lateral abdomen and across the lower abdomen percussion is flat to dull. Soft, no tenderness to the epigastrium or  other quadrants. No palpable masses.  Musculoskeletal: Normal range of motion.  Neurological: He is alert. No cranial nerve deficit.  Skin: Skin is warm.  Psychiatric: He has a normal mood and affect.  Nursing note and vitals reviewed.   Urgent Care Course     Procedures (including critical care time)  Labs Review Labs Reviewed  POCT URINALYSIS DIP (DEVICE) - Abnormal; Notable for the following:       Result Value   Bilirubin Urine SMALL (*)    Hgb urine dipstick TRACE (*)     Protein, ur 30 (*)    All other components within normal limits    Imaging Review Dg Abd 2 Views  Result Date: 04/10/2017 CLINICAL DATA:  Upper abdominal pain and diarrhea. EXAM: ABDOMEN - 2 VIEW COMPARISON:  None. FINDINGS: There are scattered air-fluid levels in the right abdomen, likely within distal small bowel as well as potentially the proximal colon. No bowel dilatation identified. No gross signs of free air. No abnormal calcifications. No soft tissue abnormalities. IMPRESSION: Findings suggestive of enteritis involving the distal small bowel and proximal colon. Electronically Signed   By: Aletta Edouard M.D.   On: 04/10/2017 14:50     Visual Acuity Review  Right Eye Distance:   Left Eye Distance:   Bilateral Distance:    Right Eye Near:   Left Eye Near:    Bilateral Near:         MDM   1. Epigastric pain   2. Diarrhea, unspecified type   3. Mild dehydration   4. Multiple air fluid levels of small intestine determined by X-ray    Patient with 5 day history of nausea and diarrhea. The first day with vomiting. Has improved very little, very little by mouth intake. Persistent epigastric pain and cramping. Air-fluid levels without bowel dilation on x-ray. Differential includes presumed infectious enteritis, ileitis, ileus, partial obstruction, pancreatitis. Discussed options of administering IV fluids here and then if he gets worse go to the emergency department and then following up with PCP next week versus evaluation and treatment today. Patient stated he wanted to get this over with and have this much done today as possible. Patient discharge to the emergency department via shuttle. Consulted with Dr. Valere Dross. Currently stable. Vital signs stable.    Janne Napoleon, NP 04/10/17 1553

## 2017-04-10 NOTE — Discharge Instructions (Signed)
Go to the emergency department now for evaluation and treatment of abdominal pain and diarrhea and mild dehydration.

## 2017-04-10 NOTE — Telephone Encounter (Signed)
Hx of diverticulitis.  1 month ago had screening/surveillance colonoscopy. Stomach pain Watery diarrhea Symptoms started Monday, night 5 days ago.  Nausea at 1st, thought bad chicken Since Tuesday -- on and off strong and dull pain below sternum. Started imodium to no relief of diarrhea. Estimates 10 BMs per day, stools are nonbloody Doesn't feel like prior diverticulitis. No recent abx  I advised urgent care or ER evaluation given it's the weekend with no other way to be evaluated. He voiced understanding.  I will forward this note to Dr. Collene Mares who can follow-up with him Monday.

## 2017-04-10 NOTE — Telephone Encounter (Signed)
Pt of Dr. Collene Mares  Contacted me as on call provider. I attempted to call the patient back x 2 to no answer He can try me again via the answering service

## 2017-04-10 NOTE — ED Provider Notes (Signed)
[place] DEPT Provider Note   CSN: [id number] Arrival date & time: 04/10/17  1608     History   Chief Complaint Chief Complaint  Patient presents with  . Diarrhea  . Abdominal Pain    HPI Ronald Avery is a 61 y.o. male.  He complains of onset symptoms, 5 days ago with sudden onset of nausea and vomiting, followed by epigastric pain, she has continued.  The emesis was brief.  He then developed diarrhea which is "like water, brown in color."  No blood in emesis or stool.  Stooling is improving since using Imodium beginning yesterday.  No prior similar problem.  No cough, fever, chest pain, focal weakness or paresthesia.  He feels generally fatigued.  He is taking his usual medications.  He went to an urgent care earlier today, had a KUB which indicated ileus. there are no other no modifying factors.  HPI  Past Medical History:  Diagnosis Date  . 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 CAD (coronary artery disease) 2001   minor- 30-40% RCA  . History of smoking   . HTN (hypertension)     Patient Active Problem List   Diagnosis Date Noted  . Human immunodeficiency virus (HIV) disease ([place]) 09/24/2014  . HTN (hypertension)   . CAD (coronary artery disease)   . History of smoking   . Aortic valve sclerosis     Past Surgical History:  Procedure Laterality Date  . KNEE ARTHROSCOPY  1973  . TESTICULAR EXPLORATION  1882   cyst       Home Medications    Prior to Admission medications   Medication Sig Start Date End Date Taking? Authorizing Provider  amLODipine (NORVASC) 10 MG tablet TAKE 1 TABLET BY MOUTH  DAILY 09/18/16  Yes Troy Sine, MD  aspirin 81 MG tablet Take 81 mg by mouth daily.   Yes [provider]  DESCOVY 200-25 MG tablet Take 1 tablet by mouth daily. 09/03/16  Yes [provider]  dolutegravir (TIVICAY) 50 MG tablet Take 50 mg by mouth daily. 02/19/16  Yes [provider]  lisinopril (PRINIVIL,ZESTRIL) 10 MG tablet Take 1  tablet (10 mg total) by mouth daily. 09/18/16  Yes Troy Sine, MD  loperamide (IMODIUM) 2 MG capsule Take 2 mg by mouth as needed for diarrhea or loose stools.   Yes [provider]  VIAGRA 100 MG tablet Take 100 mg by mouth daily as needed. 03/26/17  Yes [provider]    Family History Family History  Problem Relation Age of Onset  . Cancer Mother 30    Social History Social History  Substance Use Topics  . Smoking status: Former Research scientist (life sciences)  . Smokeless tobacco: Never Used  . Alcohol use No     Allergies   Patient has no known allergies.   Review of Systems Review of Systems  All other systems reviewed and are negative.    Physical Exam Updated Vital Signs BP 114/80   Pulse 70   Temp 97.9 F (36.6 C) (Oral)   Resp 16   SpO2 98%   Physical Exam  Constitutional: He is oriented to person, place, and time. He appears well-developed and well-nourished. No distress.  HENT:  Head: Normocephalic and atraumatic.  Right Ear: External ear normal.  Left Ear: External ear normal.  Eyes: Conjunctivae and EOM are normal. Pupils are equal, round, and reactive to light.  Neck: Normal range of motion and phonation normal. Neck supple.  Cardiovascular: Normal rate,  regular rhythm and normal heart sounds.   Pulmonary/Chest: Effort normal and breath sounds normal. He exhibits no bony tenderness.  Abdominal: Soft. He exhibits no distension and no mass. There is no tenderness. There is no guarding.  Hyperactive bowel sounds  Musculoskeletal: Normal range of motion.  Neurological: He is alert and oriented to person, place, and time. No cranial nerve deficit or sensory deficit. He exhibits normal muscle tone. Coordination normal.  Skin: Skin is warm, dry and intact.  Psychiatric: He has a normal mood and affect. His behavior is normal. Judgment and thought content normal.  Nursing note and vitals reviewed.    ED Treatments / Results  Labs (all labs ordered are  listed, but only abnormal results are displayed) Labs Reviewed  COMPREHENSIVE METABOLIC PANEL - Abnormal; Notable for the following:       Result Value   Potassium 3.4 (*)    All other components within normal limits  CBC - Abnormal; Notable for the following:    WBC 11.1 (*)    All other components within normal limits  URINALYSIS, ROUTINE W REFLEX MICROSCOPIC - Abnormal; Notable for the following:    Color, Urine AMBER (*)    Specific Gravity, Urine 1.031 (*)    All other components within normal limits  LIPASE, BLOOD    EKG  EKG Interpretation None       Radiology Dg Abd 2 Views  Result Date: 04/10/2017 CLINICAL DATA:  Upper abdominal pain and diarrhea. EXAM: ABDOMEN - 2 VIEW COMPARISON:  None. FINDINGS: There are scattered air-fluid levels in the right abdomen, likely within distal small bowel as well as potentially the proximal colon. No bowel dilatation identified. No gross signs of free air. No abnormal calcifications. No soft tissue abnormalities. IMPRESSION: Findings suggestive of enteritis involving the distal small bowel and proximal colon. Electronically Signed   By: Aletta Edouard M.D.   On: 04/10/2017 14:50    Procedures Procedures (including critical care time)  Medications Ordered in ED Medications  sodium chloride 0.9 % bolus 2,000 mL (0 mLs Intravenous Stopped 04/10/17 2034)     Initial Impression / Assessment and Plan / ED Course  I have reviewed the triage vital signs and the nursing notes.  Pertinent labs & imaging results that were available during my care of the patient were reviewed by me and considered in my medical decision making (see chart for details).     Medications  sodium chloride 0.9 % bolus 2,000 mL (0 mLs Intravenous Stopped 04/10/17 2034)    Patient Vitals for the past 24 hrs:  BP Temp Temp src Pulse Resp SpO2  04/10/17 2030 114/80 - - 70 - 98 %  04/10/17 2000 126/86 - - (!) 58 - 100 %  04/10/17 1930 135/77 - - 61 - 98 %  04/10/17  1915 128/81 - - (!) 59 - 98 %  04/10/17 1900 130/88 - - 61 - 96 %  04/10/17 1612 (!) 111/96 97.9 F (36.6 C) Oral 72 16 99 %    9:08 PM Reevaluation with update and discussion. After initial assessment and treatment, an updated evaluation reveals he is feeling some better now has not had the urge to have a stool since being here.  Findings discussed with the patient and all questions answered. Darell Saputo L    Final Clinical Impressions(s) / ED Diagnoses   Final diagnoses:  Diarrhea, unspecified type    Nonspecific enteritis, gradually improving.  Doubt serious bacterial infection, metabolic instability or impending vascular collapse.  Nursing Notes Reviewed/ Care Coordinated Applicable Imaging Reviewed Interpretation of Laboratory Data incorporated into ED treatment  The patient appears reasonably screened and/or stabilized for discharge and I doubt any other medical condition or other [facility] requiring further screening, evaluation, or treatment in the ED at this time prior to discharge.  Plan: Home Medications-Imodium as needed, continue usual medications; Home Treatments-rest, fluids; return here if the recommended treatment, does not improve the symptoms; Recommended follow up-PCP, as needed   New Prescriptions New Prescriptions   No medications on file     Daleen Bo, MD 04/10/17 2114

## 2017-04-10 NOTE — ED Notes (Signed)
Pt PO challenged currently drinking gingerale and eating saltines

## 2017-04-10 NOTE — Discharge Instructions (Signed)
Drink plenty of fluids.  Use Imodium if needed for diarrhea.  See your PCP if not better in 4 or 5 days, or if your symptoms worsen, or if you develop bleeding.

## 2017-04-10 NOTE — ED Triage Notes (Signed)
Pt sent from UC for abdominal pain, diarrhea. HAd xray done and UA, pt sent here for fluids and blood work. Denies pain at this time.

## 2017-04-21 DIAGNOSIS — R197 Diarrhea, unspecified: Secondary | ICD-10-CM | POA: Diagnosis not present

## 2017-04-21 DIAGNOSIS — R1084 Generalized abdominal pain: Secondary | ICD-10-CM | POA: Diagnosis not present

## 2017-04-22 DIAGNOSIS — R197 Diarrhea, unspecified: Secondary | ICD-10-CM | POA: Diagnosis not present

## 2017-07-07 DIAGNOSIS — I10 Essential (primary) hypertension: Secondary | ICD-10-CM | POA: Diagnosis not present

## 2017-08-02 DIAGNOSIS — Z23 Encounter for immunization: Secondary | ICD-10-CM | POA: Diagnosis not present

## 2017-10-06 ENCOUNTER — Ambulatory Visit (INDEPENDENT_AMBULATORY_CARE_PROVIDER_SITE_OTHER): Payer: 59 | Admitting: Cardiovascular Disease

## 2017-10-06 ENCOUNTER — Encounter: Payer: Self-pay | Admitting: Cardiovascular Disease

## 2017-10-06 VITALS — BP 134/79 | HR 59 | Ht 73.0 in | Wt 210.8 lb

## 2017-10-06 DIAGNOSIS — I10 Essential (primary) hypertension: Secondary | ICD-10-CM

## 2017-10-06 DIAGNOSIS — I2510 Atherosclerotic heart disease of native coronary artery without angina pectoris: Secondary | ICD-10-CM | POA: Diagnosis not present

## 2017-10-06 DIAGNOSIS — B20 Human immunodeficiency virus [HIV] disease: Secondary | ICD-10-CM

## 2017-10-06 NOTE — Patient Instructions (Signed)
Medication Instructions:  Your physician recommends that you continue on your current medications as directed. Please refer to the Current Medication list given to you today.   Labwork: none  Testing/Procedures: none  Follow-Up: Your physician wants you to follow-up in: 12 months with Dr. Claiborne Billings. You will receive a reminder letter in the mail two months in advance. If you don't receive a letter, please call our office to schedule the follow-up appointment.   Any Other Special Instructions Will Be Listed Below (If Applicable).     If you need a refill on your cardiac medications before your next appointment, please call your pharmacy.

## 2017-10-06 NOTE — Progress Notes (Signed)
Patient ID: Ronald Avery, male   DOB: 02-22-1956, 61 y.o.   MRN: [id number]     HPI: Ronald Avery is a 61 y.o. male who presents for a one year cardiology evaluation.  [name] has a history of hypertension. I first saw him in 2001 when he presented with stage II hypertension. He has been on combination therapy since that time. Because of recurrent episodes of chest tightness worrisome for angina I  performed cardiac catheterization on 08/23/2000 which revealed 30-40% mid RCA narrowing and 20% ostial stenosis involving the PDA branch of the right artery artery. He has been treated medically. His last nuclear perfusion study was in August 2008 which continued to show normal perfusion.  He has been on combination therapy for hypertension. He has had mildly elevated cholesterol levels in the in the past but these have significantly improved with aggressive diet and exercise.  When I saw him in 2014, he was on amlodipine 5 mg, metoprolol XL 25 mg, Ramipril 10 mg, and hydrochlorothiazide 12.5 mg daily for blood pressure control.  His blood pressure was stable.  He tested positive for HIV and when l saw him in 2015 hewas on triple drug therapy and is followed in infectious disease clinic at [facility]  In 2015 he was more fatigued  He was more fatigued and he decided to stop 3 of his blood pressure medications and, consequently, he's only been taking amlodipine 5 mg along with his aspirin and Atripla.  He stopped hydrochlorothiazide, Toprol-XL, and Ramipril.  When I saw him in the office, he had stage II hypertension despite taking amlodipine 5 mg and I titrated this to 10 mg and started him back on lisinopril.  Over the past 3 years, he completed a master's degree in social work and graduated in May 2017.  However, he currently is working 3 days per week at the [employer].  He has remained active and exercises fairly regularly but is not been running as much as he had in the past.  He has  continued to be on amlodipine 10 mg, and lisinopril 10 mg for hypertension.  He is now on descovy in addition to dolutegravir.  He is followed closely at [facility] infectious disease and has negative HIV virus.  He denies any chest pain.  He denies any palpitations.  Laboratory was done at [facility] on 07/07/2017.  Total cholesterol was 172, triglycerides 153, HDL 74, and LDL 94.  Serum creatinine is 1.03 with a glucose of 83.  LFTs were normal.  He presents for evaluation.  Past Medical History:  Diagnosis Date  . 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 CAD (coronary artery disease) 2001   minor- 30-40% RCA  . History of smoking   . HTN (hypertension)     Past Surgical History:  Procedure Laterality Date  . KNEE ARTHROSCOPY  1973  . TESTICULAR EXPLORATION  1882   cyst    No Known Allergies  Current Outpatient Prescriptions  Medication Sig Dispense Refill  . amLODipine (NORVASC) 10 MG tablet TAKE 1 TABLET BY MOUTH  DAILY 90 tablet 3  . aspirin 81 MG tablet Take 81 mg by mouth daily.    . DESCOVY 200-25 MG tablet Take 1 tablet by mouth daily.    . dolutegravir (TIVICAY) 50 MG tablet Take 50 mg by mouth daily.    Marland Kitchen lisinopril (PRINIVIL,ZESTRIL) 10 MG tablet Take 1 tablet (10 mg total) by mouth daily. 90 tablet 3  . VIAGRA 100 MG  tablet Take 100 mg by mouth daily as needed.  0   No current facility-administered medications for this visit.     Social History   Social History  . Marital status: Divorced    Spouse name: N/A  . Number of children: N/A  . Years of education: N/A   Occupational History  . Not on file.   Social History Main Topics  . Smoking status: Former Research scientist (life sciences)  . Smokeless tobacco: Never Used  . Alcohol use No  . Drug use: Unknown  . Sexual activity: Not on file   Other Topics Concern  . Not on file   Social History Narrative  . No narrative on file    Family History  Problem Relation Age of Onset  . Cancer Mother 16   Socially he is divorced and has 2  children. Remote tobacco history but he quit many years ago. Does drink occasional alcohol. He does exercise.   ROS General: Negative; No fevers, chills, or night sweats; positive for fatigue HEENT: Negative; No changes in vision or hearing, sinus congestion, difficulty swallowing Pulmonary: Negative; No cough, wheezing, shortness of breath, hemoptysis Cardiovascular: Negative; No chest pain, presyncope, syncope, palpitations GI: Negative; No nausea, vomiting, diarrhea, or abdominal pain GU: Negative; No dysuria, hematuria, or difficulty voiding Musculoskeletal: Negative; no myalgias, joint pain, or weakness Hematologic/Oncology: Positive for HIV; no easy bruising, bleeding Endocrine: Negative; no heat/cold intolerance; no diabetes Neuro: Negative; no changes in balance, headaches Skin: Negative; No rashes or skin lesions Psychiatric: Negative; No behavioral problems, depression Sleep: Negative; No snoring, daytime sleepiness, hypersomnolence, bruxism, restless legs, hypnogognic hallucinations, no cataplexy Other comprehensive 14 point system review is negative.  PE BP 134/79   Pulse (!) 59   Ht '6\' 1"'  (1.854 m)   Wt 210 lb 12.8 oz (95.6 kg)   BMI 27.81 kg/m    Repeat blood pressure by me was 130/80  Wt Readings from Last 3 Encounters:  10/06/17 210 lb 12.8 oz (95.6 kg)  09/16/16 211 lb (95.7 kg)  09/24/14 197 lb 12.8 oz (89.7 kg)   General: Alert, oriented, no distress.  Skin: normal turgor, no rashes, warm and dry HEENT: Normocephalic, atraumatic. Pupils equal round and reactive to light; sclera anicteric; extraocular muscles intact;  Nose without nasal septal hypertrophy Mouth/Parynx benign; Mallinpatti scale 3 Neck: No JVD, no carotid bruits; normal carotid upstroke Lungs: clear to ausculatation and percussion; no wheezing or rales Chest wall: without tenderness to palpitation Heart: PMI not displaced, RRR, s1 s2 normal, 2/6 systolic murmur, no diastolic murmur, no rubs,  gallops, thrills, or heaves Abdomen: soft, nontender; no hepatosplenomehaly, BS+; abdominal aorta nontender and not dilated by palpation. Back: no CVA tenderness Pulses 2+ Musculoskeletal: full range of motion, normal strength, no joint deformities Extremities: no clubbing cyanosis or edema, Homan's sign negative  Neurologic: grossly nonfocal; Cranial nerves grossly wnl Psychologic: Normal mood and affect   ECG (independently read by me): Sinus bradycardia at 59 bpm.  First degree AV block; PR interval 222 ms.  No ST segment changes.  October 2017 ECG (independently read by me): Normal sinus rhythm at 65 bpm.  First degree AV block with a PR interval at 216 ms.  October 2015 ECG (independently read by me): Normal sinus rhythm at 73 beats per minute.  Small Q-wave in lead 3.  No significant ST-T changes.  September 2014 ECG: Normal sinus rhythm at 61 beats per minute. Very mild first-degree AV block with PR 208 ms. Normal QTc interval.  LABS:  BMP Latest Ref Rng & Units 04/10/2017 10/08/2014  Glucose 65 - 99 mg/dL 92 101(H)  BUN 6 - 20 mg/dL 15 18  Creatinine 0.61 - 1.24 mg/dL 1.08 0.79  Sodium 135 - 145 mmol/L 137 139  Potassium 3.5 - 5.1 mmol/L 3.4(L) 4.5  Chloride 101 - 111 mmol/L 103 104  CO2 22 - 32 mmol/L 26 25  Calcium 8.9 - 10.3 mg/dL 8.9 8.7   Hepatic Function Latest Ref Rng & Units 04/10/2017 10/08/2014  Total Protein 6.5 - 8.1 g/dL 7.5 6.6  Albumin 3.5 - 5.0 g/dL 4.1 4.0  AST 15 - 41 U/L 26 19  ALT 17 - 63 U/L 30 21  Alk Phosphatase 38 - 126 U/L 70 82  Total Bilirubin 0.3 - 1.2 mg/dL 0.9 0.3   CBC Latest Ref Rng & Units 04/10/2017 10/08/2014  WBC 4.0 - 10.5 K/uL 11.1(H) 4.7  Hemoglobin 13.0 - 17.0 g/dL 16.3 15.0  Hematocrit 39.0 - 52.0 % 46.1 42.5  Platelets 150 - 400 K/uL 264 244   Lab Results  Component Value Date   MCV 87.5 04/10/2017   MCV 87.6 10/08/2014   Lab Results  Component Value Date   TSH 2.978 10/08/2014   Lipid Panel     Component Value Date/Time    CHOL 184 10/08/2014 0813   TRIG 72 10/08/2014 0813   HDL 61 10/08/2014 0813   CHOLHDL 3.0 10/08/2014 0813   VLDL 14 10/08/2014 0813   LDLCALC 109 (H) 10/08/2014 0813    IMPRESSION: 1. Essential hypertension   2. Coronary artery disease involving native coronary artery of native heart without angina pectoris   3. Human immunodeficiency virus (HIV) disease ([place])     ASSESSMENT AND PLAN: Mr. Pousson is a 61 year old Caucasian male who has a long-standing history of hypertension and when I first saw him in 2001 he presented with stage II hypertension.  He was treated initially and stabilized with  a 4 drug regimen, consisting of amlodipine, beta blocker therapy, ACE inhibition, and low-dose diuretic therapy.  A cardiac catheterization for chest pain in 2003, showed mild, nonobstructive CAD.  He remotely had smoked cigarettes but quit tobacco use. He  tested positive for HIV and by his report this is very stable after being on triple drug therapy for several years and now on 2 drug therapy.  When I saw him 3 years ago.  He again presented with stage II hypertension after stopping 3 Ms. for medications.  Recently, his blood pressure is stabilized on his current regimen consisting of lisinopril 10 mg and amlodipine 10 mg.  Blood pressure today is stable and a repeat by me was 130/80.  His ECG reveals mild sinus bradycardia 59 bpm with stable first-degree AV block.  His HIV is controlled with undetectable virus on therapy.  I encouraged him to continue exercise.  As long as he is stable,  willl see him in one year for reevaluation.   Troy Sine, MD, [facility]  10/07/2017 10:01 PM

## 2017-12-12 IMAGING — DX DG ABDOMEN 2V
2 series · 2 of 2 positions shown · non-contrast
Comparison: None.

CLINICAL DATA: Upper abdominal pain and diarrhea.

EXAM:
ABDOMEN - 2 VIEW

[abdomen erect]
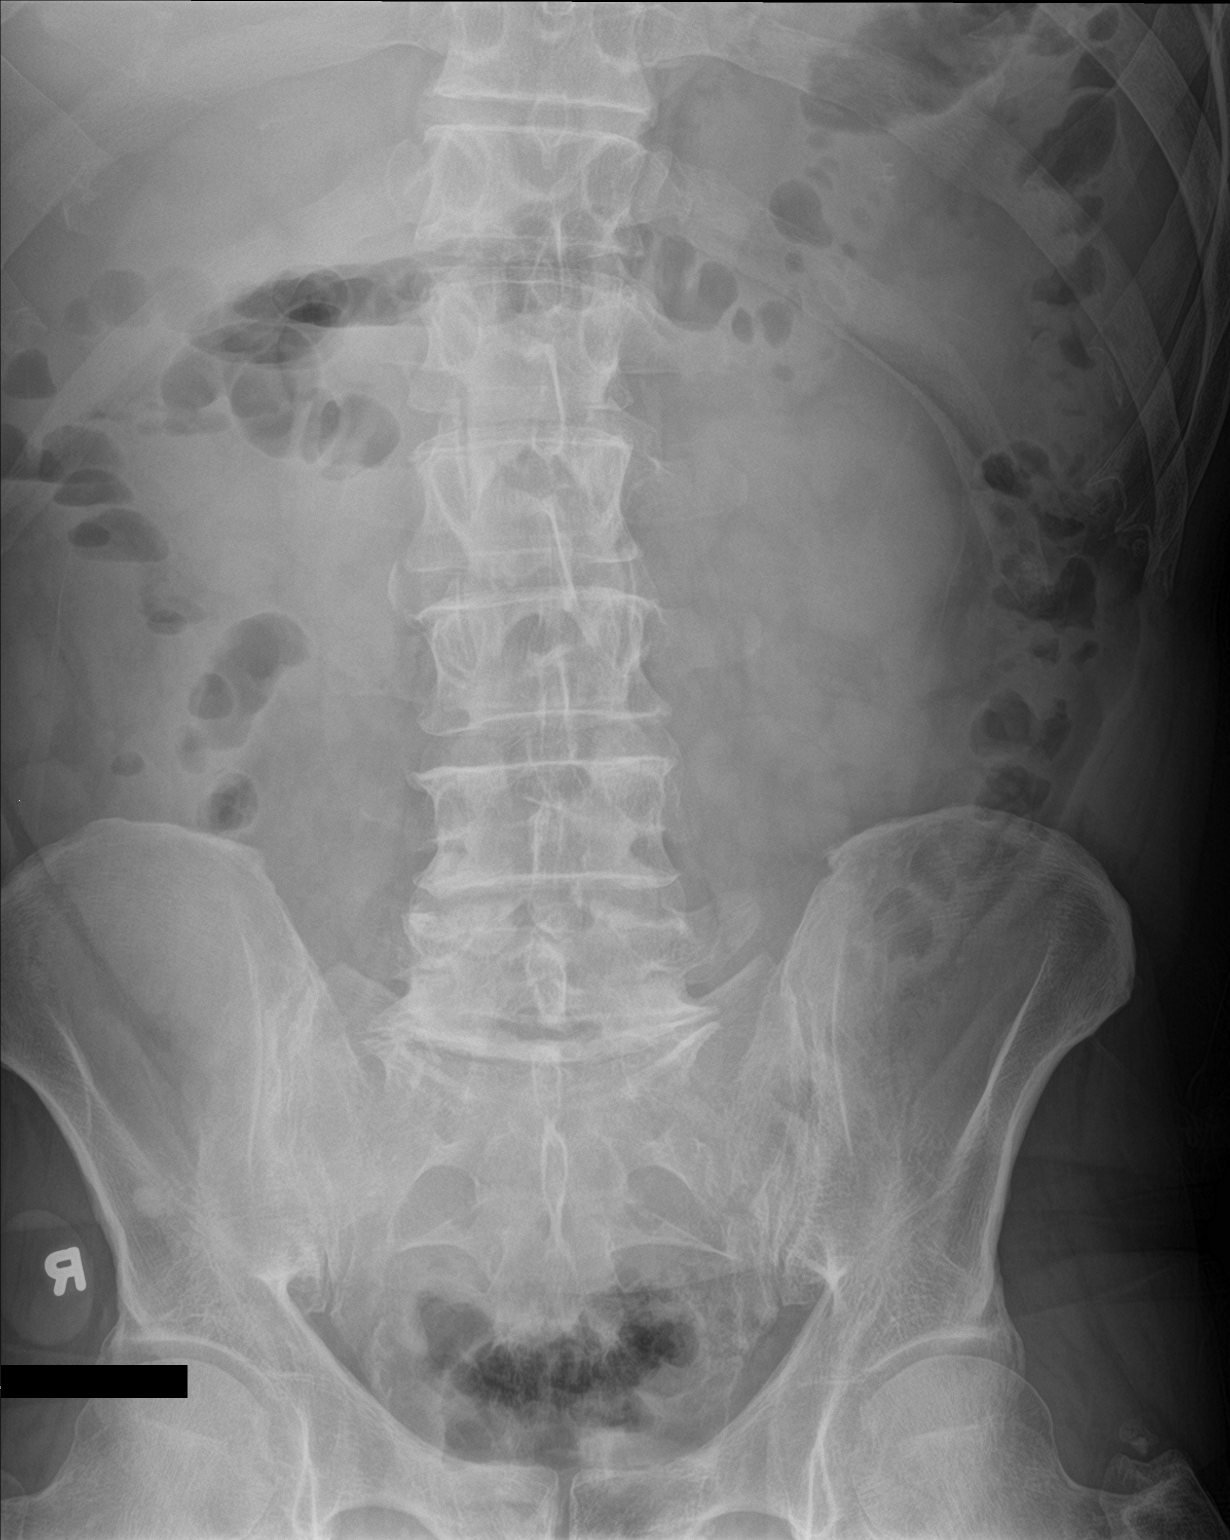

[abdomen supine]
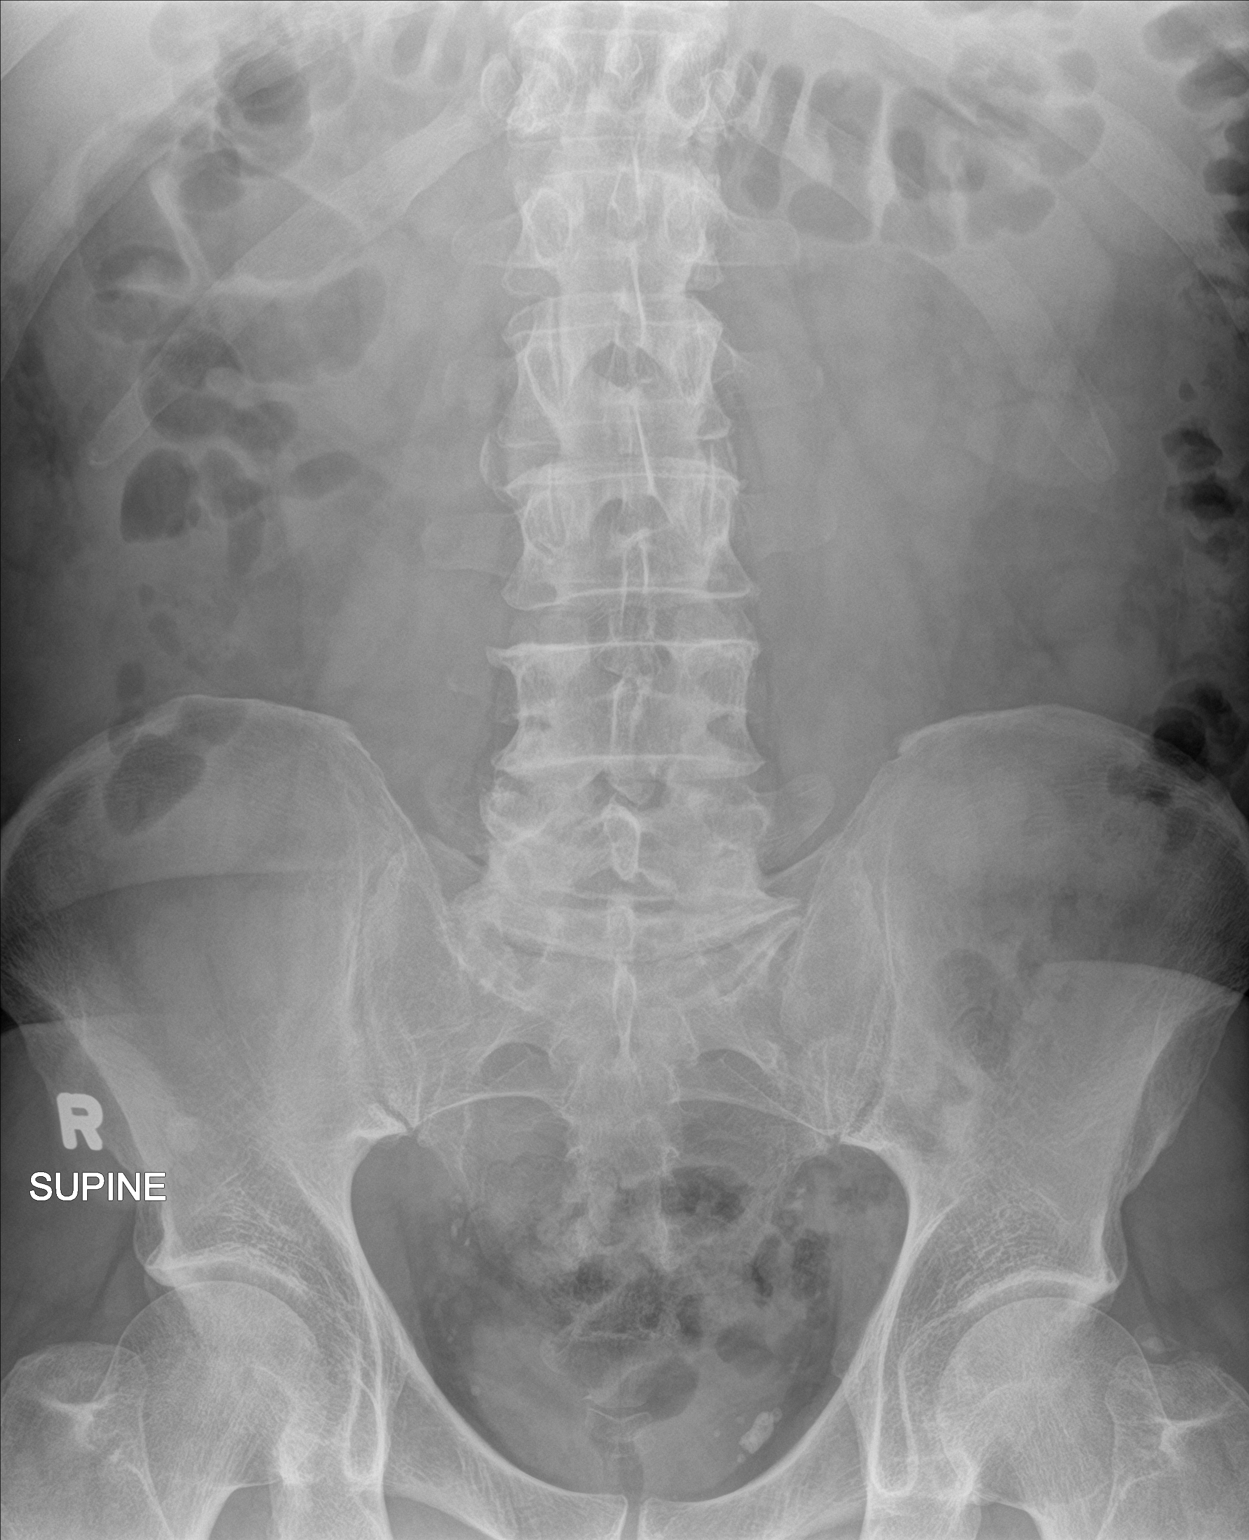

[2 of 2 positions shown; findings below may reference images not displayed]

FINDINGS: There are scattered air-fluid levels in the right abdomen, likely
within distal small bowel as well as potentially the proximal colon.
No bowel dilatation identified. No gross signs of free air. No
abnormal calcifications. No soft tissue abnormalities.
IMPRESSION: Findings suggestive of enteritis involving the distal small bowel
and proximal colon.

## 2017-12-29 DIAGNOSIS — Z1283 Encounter for screening for malignant neoplasm of skin: Secondary | ICD-10-CM | POA: Diagnosis not present

## 2017-12-29 DIAGNOSIS — R85619 Unspecified abnormal cytological findings in specimens from anus: Secondary | ICD-10-CM | POA: Diagnosis not present

## 2018-03-17 DIAGNOSIS — R85619 Unspecified abnormal cytological findings in specimens from anus: Secondary | ICD-10-CM | POA: Diagnosis not present

## 2018-03-17 DIAGNOSIS — K6282 Dysplasia of anus: Secondary | ICD-10-CM | POA: Diagnosis not present

## 2018-03-24 ENCOUNTER — Encounter: Payer: Self-pay | Admitting: Family Medicine

## 2018-03-24 ENCOUNTER — Ambulatory Visit: Payer: 59 | Admitting: Family Medicine

## 2018-03-24 ENCOUNTER — Other Ambulatory Visit: Payer: Self-pay

## 2018-03-24 VITALS — BP 129/71 | HR 72 | Temp 99.8°F | Resp 17 | Ht 73.0 in | Wt 206.0 lb

## 2018-03-24 DIAGNOSIS — B9689 Other specified bacterial agents as the cause of diseases classified elsewhere: Secondary | ICD-10-CM

## 2018-03-24 DIAGNOSIS — J301 Allergic rhinitis due to pollen: Secondary | ICD-10-CM | POA: Diagnosis not present

## 2018-03-24 DIAGNOSIS — R509 Fever, unspecified: Secondary | ICD-10-CM | POA: Diagnosis not present

## 2018-03-24 DIAGNOSIS — J019 Acute sinusitis, unspecified: Secondary | ICD-10-CM

## 2018-03-24 MED ORDER — FLUTICASONE PROPIONATE 50 MCG/ACT NA SUSP
2.0000 | Freq: Every day | NASAL | 6 refills | Status: DC
Start: 2018-03-24 — End: 2018-08-26

## 2018-03-24 MED ORDER — AMOXICILLIN-POT CLAVULANATE 875-125 MG PO TABS: 1.0000 | ORAL_TABLET | Freq: Two times a day (BID) | ORAL | 0 refills | Status: DC

## 2018-03-24 NOTE — Progress Notes (Signed)
Chief Complaint  Patient presents with  . URI    onset: when pollen started falling, cough that makes his chest hurt, stuffy nose, watery eyes, tried tylenol and mucinex for symptoms but none today.  Highest temp fo 101, mucus drainage is clear per pt    HPI   Pt reports that he has been coughing  Onset 5 days ago  Last fever 101 early this morning  Reports that he was unable to work for the past 3 days He has been having fatigue He is taking tylenol, mucinex and antihistamine He has a history of seasonal allergies He has facial pain, neck pain and chest congestion He denies sick contacts   Past Medical History:  Diagnosis Date  . 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 CAD (coronary artery disease) 2001   minor- 30-40% RCA  . History of smoking   . HTN (hypertension)     Current Outpatient Medications  Medication Sig Dispense Refill  . amLODipine (NORVASC) 10 MG tablet TAKE 1 TABLET BY MOUTH  DAILY 90 tablet 3  . aspirin 81 MG tablet Take 81 mg by mouth daily.    . DESCOVY 200-25 MG tablet Take 1 tablet by mouth daily.    . dolutegravir (TIVICAY) 50 MG tablet Take 50 mg by mouth daily.    Marland Kitchen lisinopril (PRINIVIL,ZESTRIL) 10 MG tablet Take 1 tablet (10 mg total) by mouth daily. 90 tablet 3  . amoxicillin-clavulanate (AUGMENTIN) 875-125 MG tablet Take 1 tablet by mouth 2 (two) times daily. 20 tablet 0  . fluticasone (FLONASE) 50 MCG/ACT nasal spray Place 2 sprays into both nostrils daily. 16 g 6  . VIAGRA 100 MG tablet Take 100 mg by mouth daily as needed.  0   No current facility-administered medications for this visit.     Allergies: No Known Allergies  Past Surgical History:  Procedure Laterality Date  . KNEE ARTHROSCOPY  1973  . TESTICULAR EXPLORATION  1882   cyst    Social History   Socioeconomic History  . Marital status: Divorced    Spouse name: Not on file  . Number of children: Not on file  . Years of education: Not on file  . Highest education level: Not on  file  Occupational History  . Not on file  Social Needs  . Financial resource strain: Not on file  . Food insecurity:    Worry: Not on file    Inability: Not on file  . Transportation needs:    Medical: Not on file    Non-medical: Not on file  Tobacco Use  . Smoking status: Former Research scientist (life sciences)  . Smokeless tobacco: Never Used  Substance and Sexual Activity  . Alcohol use: No  . Drug use: Not on file  . Sexual activity: Not on file  Lifestyle  . Physical activity:    Days per week: Not on file    Minutes per session: Not on file  . Stress: Not on file  Relationships  . Social connections:    Talks on phone: Not on file    Gets together: Not on file    Attends religious service: Not on file    Active member of club or organization: Not on file    Attends meetings of clubs or organizations: Not on file    Relationship status: Not on file  Other Topics Concern  . Not on file  Social History Narrative  . Not on file    Family History  Problem Relation Age of  Onset  . Cancer Mother 35     ROS Review of Systems See HPI No malaise No diaphoresis Skin: No rash or itching Eyes: no blurry vision, no double vision GU: no dysuria or hematuria Neuro: no dizziness or headaches  all others reviewed and negative   Objective: Vitals:   03/24/18 0827  BP: 129/71  Pulse: 72  Resp: 17  Temp: 99.8 F (37.7 C)  TempSrc: Oral  SpO2: 95%  Weight: 206 lb (93.4 kg)  Height: 6\' 1"  (1.854 m)    Physical Exam  General: alert, oriented, in NAD Head: normocephalic, atraumatic, no sinus tenderness Eyes: EOM intact, no scleral icterus or conjunctival injection Ears: TM clear bilaterally Nose: mucosa +erythematous, +edematous Throat: no pharyngeal exudate or erythema Lymph: no posterior auricular, submental or cervical lymph adenopathy Heart: normal rate, normal sinus rhythm, no murmurs Lungs: clear to auscultation bilaterally, +wheezing   Assessment and Plan Ronald Avery was seen  today for uri.  Diagnoses and all orders for this visit:  Fever and chills -     Cancel: POCT Influenza A/B  Acute bacterial sinusitis -     amoxicillin-clavulanate (AUGMENTIN) 875-125 MG tablet; Take 1 tablet by mouth 2 (two) times daily.  Seasonal allergic rhinitis due to pollen -     fluticasone (FLONASE) 50 MCG/ACT nasal spray; Place 2 sprays into both nostrils daily.  Advised antibiotic Continue antihistamine, tylenol and mucinex Discussed avoidance of pollen  Add flonase Follow up prn   Ronald Avery A Nolon Rod

## 2018-03-24 NOTE — Patient Instructions (Addendum)
     IF you received an x-ray today, you will receive an invoice from [facility]. Please contact [facility] at [phone] with questions or concerns regarding your invoice.   IF you received labwork today, you will receive an invoice from [place]. Please contact [facility] at [phone] with questions or concerns regarding your invoice.   Our billing staff will not be able to assist you with questions regarding bills from these companies.  You will be contacted with the lab results as soon as they are available. The fastest way to get your results is to activate your My Chart account. Instructions are located on the last page of this paperwork. If you have not heard from [place] regarding the results in 2 weeks, please contact this office.    Sinus Rinse What is a sinus rinse? A sinus rinse is a simple home treatment that is used to rinse your sinuses with a sterile mixture of salt and water (saline solution). Sinuses are air-filled spaces in your skull behind the bones of your face and forehead that open into your nasal cavity. You will use the following:  Saline solution.  Neti pot or spray bottle. This releases the saline solution into your nose and through your sinuses. Neti pots and spray bottles can be purchased at [employer], a health food store, or online.  When would I do a sinus rinse? A sinus rinse can help to clear mucus, dirt, dust, or pollen from the nasal cavity. You may do a sinus rinse when you have a cold, a virus, nasal allergy symptoms, a sinus infection, or stuffiness in the nose or sinuses. If you are considering a sinus rinse:  Ask your child's health care provider before performing a sinus rinse on your child.  Do not do a sinus rinse if you have had ear or nasal surgery, ear infection, or blocked ears.  How do I do a sinus rinse?  Wash your hands.  Disinfect your device according to the directions provided and then dry it.  Use  the solution that comes with your device or one that is sold separately in stores. Follow the mixing directions on the package.  Fill your device with the amount of saline solution as directed by the device instructions.  Stand over a sink and tilt your head sideways over the sink.  Place the spout of the device in your upper nostril (the one closer to the ceiling).  Gently pour or squeeze the saline solution into the nasal cavity. The liquid should drain to the lower nostril if you are not overly congested.  Gently blow your nose. Blowing too hard may cause ear pain.  Repeat in the other nostril.  Clean and rinse your device with clean water and then air-dry it. Are there risks of a sinus rinse? Sinus rinse is generally very safe and effective. However, there are a few risks, which include:  A burning sensation in the sinuses. This may happen if you do not make the saline solution as directed. Make sure to follow all directions when making the saline solution.  Infection from contaminated water. This is rare, but possible.  Nasal irritation.  This information is not intended to replace advice given to you by your health care provider. Make sure you discuss any questions you have with your health care provider. Document Released: 06/20/2014 Document Revised: 10/20/2016 Document Reviewed: 04/10/2014 Elsevier Interactive Patient Education  2017 [employer].

## 2018-03-24 NOTE — Addendum Note (Signed)
Addended by: Delia Chimes A on: 03/24/2018 10:27 AM   Modules accepted: Level of Service

## 2018-05-25 DIAGNOSIS — L309 Dermatitis, unspecified: Secondary | ICD-10-CM | POA: Diagnosis not present

## 2018-06-22 DIAGNOSIS — R740 Nonspecific elevation of levels of transaminase and lactic acid dehydrogenase [LDH]: Secondary | ICD-10-CM | POA: Diagnosis not present

## 2018-06-22 DIAGNOSIS — I714 Abdominal aortic aneurysm, without rupture: Secondary | ICD-10-CM | POA: Diagnosis not present

## 2018-06-22 DIAGNOSIS — R768 Other specified abnormal immunological findings in serum: Secondary | ICD-10-CM | POA: Diagnosis not present

## 2018-06-22 DIAGNOSIS — R161 Splenomegaly, not elsewhere classified: Secondary | ICD-10-CM | POA: Diagnosis not present

## 2018-08-26 ENCOUNTER — Ambulatory Visit: Payer: 59 | Admitting: Family Medicine

## 2018-08-26 ENCOUNTER — Encounter: Payer: Self-pay | Admitting: Family Medicine

## 2018-08-26 VITALS — BP 118/64 | HR 64 | Ht 73.0 in | Wt 190.0 lb

## 2018-08-26 DIAGNOSIS — G5701 Lesion of sciatic nerve, right lower limb: Secondary | ICD-10-CM

## 2018-08-26 MED ORDER — PREDNISONE 5 MG PO TABS: ORAL_TABLET | ORAL | 0 refills | Status: DC

## 2018-08-26 NOTE — Assessment & Plan Note (Signed)
He has tightness in his hamstrings and appears that his piriformis is scissoring down on the sciatic nerve.  Having radicular symptoms on the right leg. -Prednisone. -Counseled on home exercise therapy. -Counseled on supportive care. -If no improvement consider physical therapy or injection.

## 2018-08-26 NOTE — Patient Instructions (Signed)
Nice to meet you  Please try the medicine  Please try heat or ice on the area  Please continue the stretching  Please try massaging the buttock with a lacrosse ball  Please follow up with me in 3-4 weeks if your pain isn't improving

## 2018-08-26 NOTE — Progress Notes (Signed)
[name] - 62 y.o. male MRN [id number]  Date of birth: 05-16-1956  SUBJECTIVE:  Including CC & ROS.  Chief Complaint  Patient presents with  . Leg Pain    Ronald Avery is a 62 y.o. male that is presenting with right gluteal pain. Pain has been ongoing for three weeks ago. He noticed the pain after lifting weights, he recently started lifting again. Pain is localized at his right gluteal. Admits to intermittent tingling down his leg. Pain is mild to severe standing and sitting. He is unable to get in a comfortable position.  He has taking Motrin and has done acupuncture.   Review of Systems  Constitutional: Negative for fever.  HENT: Negative for congestion.   Respiratory: Negative for cough.   Cardiovascular: Negative for chest pain.  Gastrointestinal: Negative for abdominal pain.  Musculoskeletal: Negative for gait problem.  Skin: Negative for color change.  Neurological: Negative for weakness.  Hematological: Negative for adenopathy.  Psychiatric/Behavioral: Negative for agitation.    HISTORY: Past Medical, Surgical, Social, and Family History Reviewed & Updated per EMR.   Pertinent Historical Findings include:  Past Medical History:  Diagnosis Date  . 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 CAD (coronary artery disease) 2001   minor- 30-40% RCA  . History of smoking   . HTN (hypertension)     Past Surgical History:  Procedure Laterality Date  . KNEE ARTHROSCOPY  1973  . TESTICULAR EXPLORATION  1882   cyst    No Known Allergies  Family History  Problem Relation Age of Onset  . Cancer Mother 51     Social History   Socioeconomic History  . Marital status: Divorced    Spouse name: Not on file  . Number of children: Not on file  . Years of education: Not on file  . Highest education level: Not on file  Occupational History  . Not on file  Social Needs  . Financial resource strain: Not on file  . Food insecurity:    Worry: Not on file    Inability:  Not on file  . Transportation needs:    Medical: Not on file    Non-medical: Not on file  Tobacco Use  . Smoking status: Former Research scientist (life sciences)  . Smokeless tobacco: Never Used  Substance and Sexual Activity  . Alcohol use: No  . Drug use: Not on file  . Sexual activity: Not on file  Lifestyle  . Physical activity:    Days per week: Not on file    Minutes per session: Not on file  . Stress: Not on file  Relationships  . Social connections:    Talks on phone: Not on file    Gets together: Not on file    Attends religious service: Not on file    Active member of club or organization: Not on file    Attends meetings of clubs or organizations: Not on file    Relationship status: Not on file  . Intimate partner violence:    Fear of current or ex partner: Not on file    Emotionally abused: Not on file    Physically abused: Not on file    Forced sexual activity: Not on file  Other Topics Concern  . Not on file  Social History Narrative  . Not on file     PHYSICAL EXAM:  VS: BP 118/64   Pulse 64   Ht 6\' 1"  (1.854 m)   Wt 190 lb (86.2 kg)  SpO2 98%   BMI 25.07 kg/m  Physical Exam Gen: NAD, alert, cooperative with exam, well-appearing ENT: normal lips, normal nasal mucosa,  Eye: normal EOM, normal conjunctiva and lids CV:  no edema, +2 pedal pulses   Resp: no accessory muscle use, non-labored,  Skin: no rashes, no areas of induration  Neuro: normal tone, normal sensation to touch Psych:  normal insight, alert and oriented MSK:  Back/Right hip:  Tenderness to palpation of the right piriformis. No tenderness to palpation of the SI joint or greater trochanter. Normal internal and external rotation  Normal strength resistance with hip flexion, knee flexion extension, plantarflexion and dorsiflexion. Normal deep tendon reflexes at the patella bilaterally. Positive straight leg raise on the right. Neurovascular intact     ASSESSMENT & PLAN:   Piriformis syndrome of right  side He has tightness in his hamstrings and appears that his piriformis is scissoring down on the sciatic nerve.  Having radicular symptoms on the right leg. -Prednisone. -Counseled on home exercise therapy. -Counseled on supportive care. -If no improvement consider physical therapy or injection.

## 2018-11-09 DIAGNOSIS — Z6828 Body mass index (BMI) 28.0-28.9, adult: Secondary | ICD-10-CM | POA: Diagnosis not present

## 2018-12-09 ENCOUNTER — Encounter: Payer: Self-pay | Admitting: Gastroenterology

## 2018-12-23 ENCOUNTER — Other Ambulatory Visit (INDEPENDENT_AMBULATORY_CARE_PROVIDER_SITE_OTHER): Payer: 59

## 2018-12-23 ENCOUNTER — Ambulatory Visit (INDEPENDENT_AMBULATORY_CARE_PROVIDER_SITE_OTHER): Payer: 59 | Admitting: Internal Medicine

## 2018-12-23 ENCOUNTER — Encounter: Payer: Self-pay | Admitting: Internal Medicine

## 2018-12-23 VITALS — BP 126/68 | HR 76 | Ht 70.75 in | Wt 217.4 lb

## 2018-12-23 DIAGNOSIS — D013 Carcinoma in situ of anus and anal canal: Secondary | ICD-10-CM

## 2018-12-23 DIAGNOSIS — R932 Abnormal findings on diagnostic imaging of liver and biliary tract: Secondary | ICD-10-CM

## 2018-12-23 DIAGNOSIS — R748 Abnormal levels of other serum enzymes: Secondary | ICD-10-CM | POA: Diagnosis not present

## 2018-12-23 DIAGNOSIS — K6282 Dysplasia of anus: Secondary | ICD-10-CM

## 2018-12-23 LAB — HEPATIC FUNCTION PANEL
ALBUMIN: 4.6 g/dL (ref 3.5–5.2)
ALT: 40 U/L (ref 0–53)
AST: 31 U/L (ref 0–37)
Alkaline Phosphatase: 59 U/L (ref 39–117)
Bilirubin, Direct: 0.1 mg/dL (ref 0.0–0.3)
Total Bilirubin: 0.6 mg/dL (ref 0.2–1.2)
Total Protein: 7.7 g/dL (ref 6.0–8.3)

## 2018-12-23 LAB — FERRITIN: FERRITIN: 80.4 ng/mL (ref 22.0–322.0)

## 2018-12-23 NOTE — Patient Instructions (Addendum)
Your provider has requested that you go to the basement level for lab work before leaving today. Press "B" on the elevator. The lab is located at the first door on the left as you exit the elevator.   You have been scheduled for an abdominal ultrasound at [facility] on 12/26/2018 at 9:15. Please arrive 20 minutes prior to your appointment for registration. Make certain not to have anything to eat or drink 9 hours prior to your appointment. Should you need to reschedule your appointment, please contact radiology at [phone]. This test typically takes about 30 minutes to perform.  We are going to obtain your records from [facility] office for review.  I appreciate the opportunity to care for you. Silvano Rusk, MD, [facility]

## 2018-12-23 NOTE — Progress Notes (Signed)
Dimetrius Montfort Pakula 63 y.o. [birth year] [id number]  Assessment & Plan:   Encounter Diagnoses  Name Primary?  . Abnormal transaminases Yes  . Abnormal ultrasound of liver   . AIN (anal intraepithelial neoplasia) anal canal    Episodically abnormal transaminases and an HIV-positive man with irregular liver echotexture on  ultrasound in July 2019, and a history of hepatitis C positive antibody but negative RNA on multiple occasions.  Presumably he cleared that on his own.  He has never been treated.  No evidence for significant liver dysfunction on exam or objective laboratory testing or imaging.    He needs additional work-up as follows:  Orders Placed This Encounter  Procedures  . US Abdomen Complete  . Ferritin  . ANA  . Hepatitis B core antibody, total  . Hepatitis B surface antibody,qualitative  . Hepatitis B surface antigen  . Hepatitis A antibody, total  . Alpha-1-antitrypsin  . Anti-smooth muscle antibody, IgG  . Hepatic function panel    Once I have reviewed these labs and the ultrasound I will contact him. I will obtain his previous colonoscopy results from Dr. Collene Mares.  I appreciate the opportunity to care for this patient. Cc: Walker Kehr, MD [facility]  Subjective:   Chief Complaint: abnormal liver tests  HPI The patient is a 63 year old white man here at the request of Dr. Bayard Hugger regarding abnormal transaminases and abnormal liver imaging.  He is followed by her for HIV positive and has done well in that sense, on his treatment.  Over the past years he has had some abnormal transaminases which are episodic, in March 2017 AST 92 ALT 192, and in June 2019 AST 102 ALT 81 and as recently as 11/09/2018 AST 56 ALT 64.  In between and within a month or so some of these tests LFTs returned normal.  He is never had elevation in alkaline phosphatase or bilirubin.  An ultrasound in July of last year demonstrated heterogeneous and echogenic liver and mild  splenomegaly.  He does not have any active GI symptoms or symptoms of liver disease.  No family history of liver disease.  Alcohol use is rare at best 1 drink a week.  No Known Allergies Current Meds  Medication Sig  . amLODipine (NORVASC) 10 MG tablet TAKE 1 TABLET BY MOUTH  DAILY  . aspirin 81 MG tablet Take 81 mg by mouth daily.  . bictegravir-emtricitabine-tenofovir AF (BIKTARVY) 50-200-25 MG TABS tablet Take 1 tablet by mouth daily.  Marland Kitchen lisinopril (PRINIVIL,ZESTRIL) 10 MG tablet Take 1 tablet (10 mg total) by mouth daily.  Marland Kitchen VIAGRA 100 MG tablet Take 100 mg by mouth daily as needed.   Past Medical History:  Diagnosis Date  . AIN (anal intraepithelial neoplasia) anal canal   . Anxiety   . 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 CAD (coronary artery disease) 2001   minor- 30-40% RCA  . Hepatitis C   . History of smoking   . HIV (human immunodeficiency virus infection) ([place])   . HTN (hypertension)   . Hydrocele   . Psoriasis    Past Surgical History:  Procedure Laterality Date  . CARDIAC CATHETERIZATION    . COLONOSCOPY  2018  . KNEE ARTHROSCOPY Right 1973  . TESTICULAR EXPLORATION Left 1882   cyst   Social History   Social History Narrative   Retired from [employer]   Works part-time at [employer], 1 son 1 daughter   Male partners  after divorce   EtOH is rarae max 1/week   No tobacco (former smoker)   No drugs   family history includes Cancer in his father; Cancer (age of onset: 9) in his mother; Heart disease in his mother.   Review of Systems As per HPI , right pririformis pain, dry skin All other ROS negative  Objective:   Physical Exam @BP  126/68 (BP Location: Left Arm, Patient Position: Sitting, Cuff Size: Normal)   Pulse 76   Ht 5' 10.75" (1.797 m) Comment: height measured without shoes  Wt 217 lb 6 oz (98.6 kg)   BMI 30.53 kg/m @  General:  Well-developed, well-nourished and in no acute distress Eyes:  anicteric. ENT:   Mouth and  posterior pharynx free of lesions.  Neck:   supple w/o thyromegaly or mass.  Lungs: Clear to auscultation bilaterally. Heart:  S1S2, no rubs, murmurs, gallops. Abdomen:  soft, non-tender, no hepatosplenomegaly, hernia, or mass and BS+.  Lymph:  no cervical or supraclavicular adenopathy. Extremities:   no edema, cyanosis or clubbing Skin   no rash. No stigmata of chronic liver disease Neuro:  A&O x 3.  Psych:  appropriate mood and  Affect.   Data Reviewed: See HPI

## 2018-12-26 ENCOUNTER — Inpatient Hospital Stay: Admission: RE | Admit: 2018-12-26 | Payer: 59 | Source: Ambulatory Visit

## 2018-12-28 LAB — ANTI-SMOOTH MUSCLE ANTIBODY, IGG

## 2018-12-28 LAB — HEPATITIS B SURFACE ANTIBODY,QUALITATIVE: Hep B S Ab: REACTIVE — AB

## 2018-12-28 LAB — ALPHA-1-ANTITRYPSIN: A-1 Antitrypsin, Ser: 147 mg/dL (ref 83–199)

## 2018-12-28 LAB — HEPATITIS A ANTIBODY, TOTAL: Hepatitis A AB,Total: REACTIVE — AB

## 2018-12-28 LAB — HEPATITIS B SURFACE ANTIGEN: Hepatitis B Surface Ag: NONREACTIVE

## 2018-12-28 LAB — ANA: ANA: NEGATIVE

## 2018-12-28 LAB — HEPATITIS B CORE ANTIBODY, TOTAL: Hep B Core Total Ab: REACTIVE — AB

## 2019-01-02 ENCOUNTER — Ambulatory Visit
Admission: RE | Admit: 2019-01-02 | Discharge: 2019-01-02 | Disposition: A | Discharge status: Home / Self Care | Payer: 59 | Source: Ambulatory Visit | Attending: Internal Medicine | Admitting: Internal Medicine

## 2019-01-02 DIAGNOSIS — R748 Abnormal levels of other serum enzymes: Secondary | ICD-10-CM

## 2019-01-02 DIAGNOSIS — R932 Abnormal findings on diagnostic imaging of liver and biliary tract: Secondary | ICD-10-CM

## 2019-01-02 DIAGNOSIS — I714 Abdominal aortic aneurysm, without rupture: Secondary | ICD-10-CM | POA: Diagnosis not present

## 2019-01-06 ENCOUNTER — Other Ambulatory Visit: Payer: Self-pay

## 2019-01-06 DIAGNOSIS — R748 Abnormal levels of other serum enzymes: Secondary | ICD-10-CM

## 2019-01-06 NOTE — Progress Notes (Signed)
Additional lab tests are ok - no evidence of chronic liver disease on the labs and LFT's back to normal Cause of fluctuating LFT's not clear Also had an [place] that showed some increased echogenicity of the liver - means liver is somewhat dense and is not specific but suggests increased fat in liver which is common and usually not dangerous.  At this point I do not have an explanation for the abnormal LFT's  I would like him to do LFTs again in first week of March and we will contact him with those results and plans then

## 2019-01-06 NOTE — Progress Notes (Signed)
See result note for serologies

## 2019-01-30 DIAGNOSIS — M25551 Pain in right hip: Secondary | ICD-10-CM | POA: Diagnosis not present

## 2019-01-30 DIAGNOSIS — M545 Low back pain: Secondary | ICD-10-CM | POA: Diagnosis not present

## 2019-02-03 DIAGNOSIS — M25551 Pain in right hip: Secondary | ICD-10-CM | POA: Diagnosis not present

## 2019-02-03 DIAGNOSIS — M545 Low back pain: Secondary | ICD-10-CM | POA: Diagnosis not present

## 2019-02-06 DIAGNOSIS — M545 Low back pain: Secondary | ICD-10-CM | POA: Diagnosis not present

## 2019-02-06 DIAGNOSIS — M25551 Pain in right hip: Secondary | ICD-10-CM | POA: Diagnosis not present

## 2019-02-09 DIAGNOSIS — M545 Low back pain: Secondary | ICD-10-CM | POA: Diagnosis not present

## 2019-02-09 DIAGNOSIS — M25551 Pain in right hip: Secondary | ICD-10-CM | POA: Diagnosis not present

## 2019-02-13 DIAGNOSIS — M25551 Pain in right hip: Secondary | ICD-10-CM | POA: Diagnosis not present

## 2019-02-13 DIAGNOSIS — M545 Low back pain: Secondary | ICD-10-CM | POA: Diagnosis not present

## 2019-02-16 DIAGNOSIS — M545 Low back pain: Secondary | ICD-10-CM | POA: Diagnosis not present

## 2019-02-16 DIAGNOSIS — M25551 Pain in right hip: Secondary | ICD-10-CM | POA: Diagnosis not present

## 2019-08-14 IMAGING — US US ABDOMEN COMPLETE
1 series · 13 of 25 positions shown · non-contrast
Comparison: None

CLINICAL DATA: Abnormal LFTs, elevated transaminases; history of
heterogeneous liver with and atherosclerotic changes by outside
ultrasound at [HOSPITAL] on 06/22/2018; history hypertension, HIV, appetite
is C

EXAM:
ABDOMEN ULTRASOUND COMPLETE

[Series 1: us abdomen complete · 0.22mm/px · 13 of 84 slices shown]
[im 1/84]
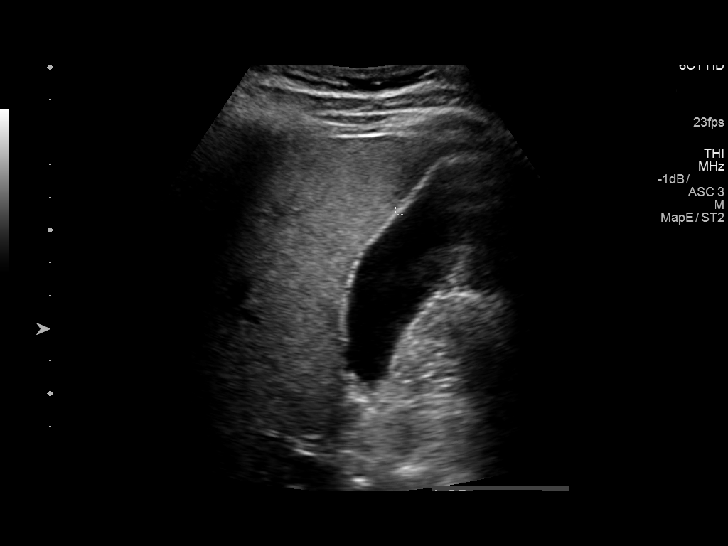
[im 7/84]
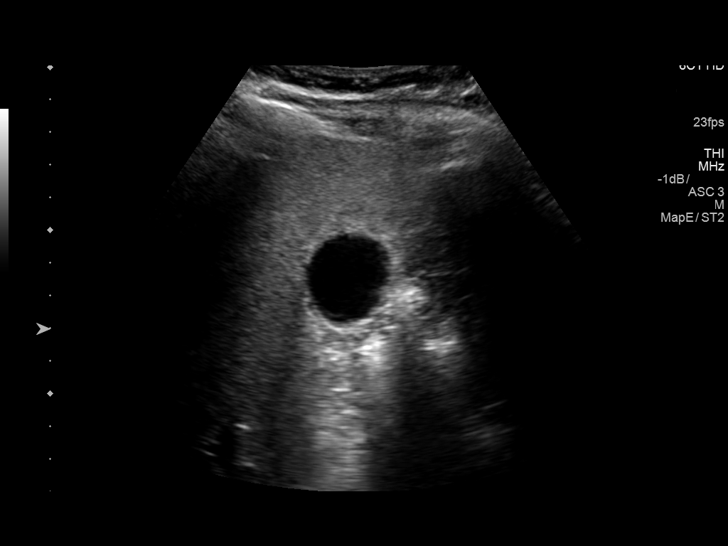
[im 14/84]
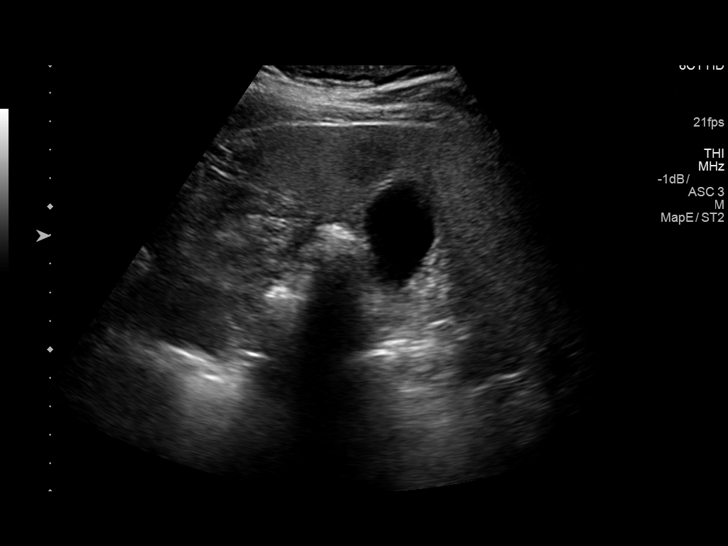
[im 21/84]
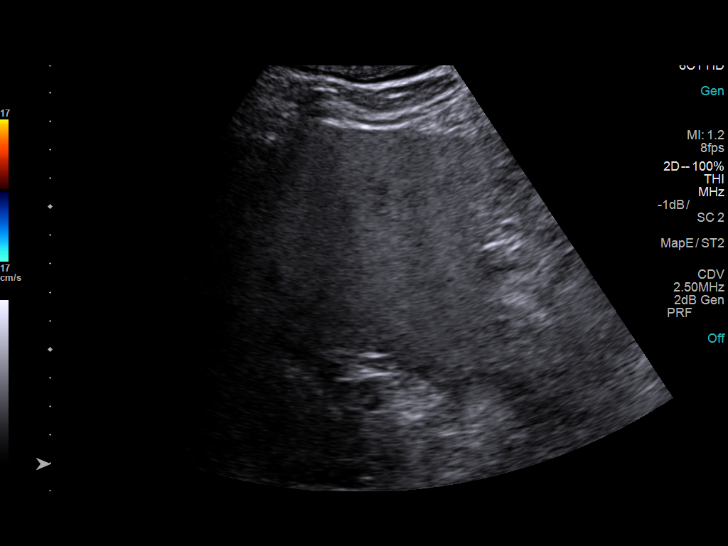
[im 28/84]
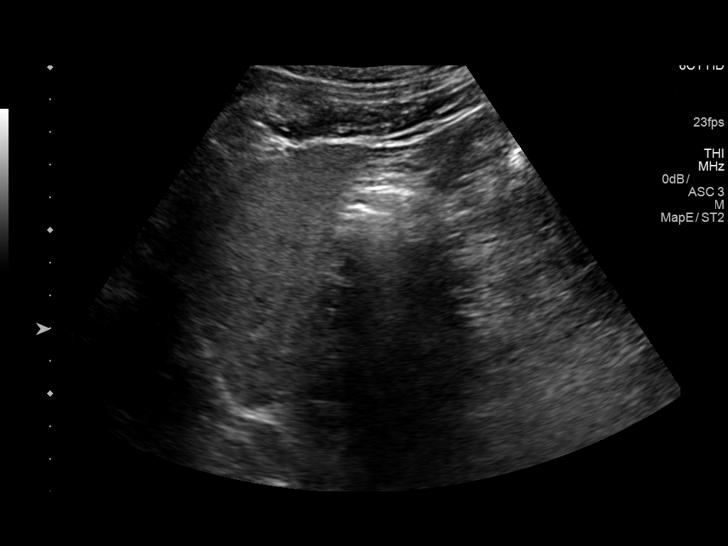
[im 35/84]
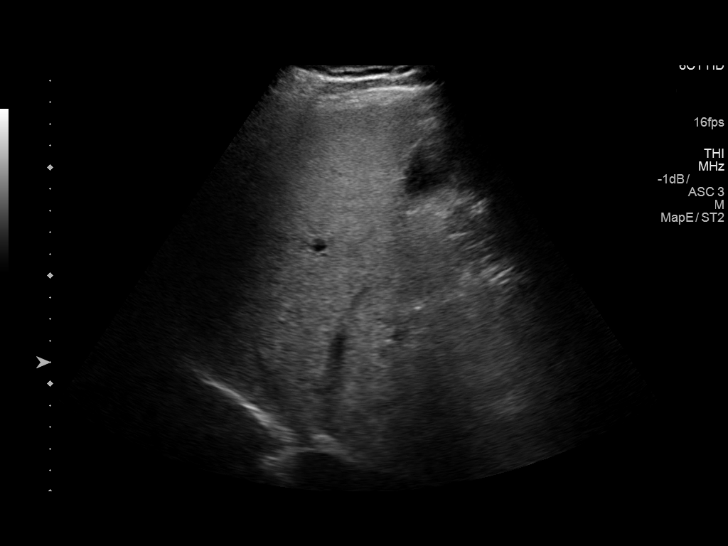
[im 42/84]
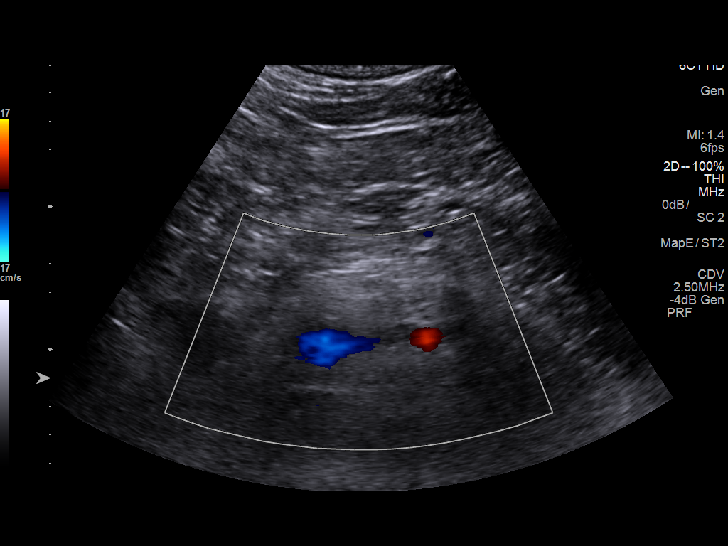
[im 49/84]
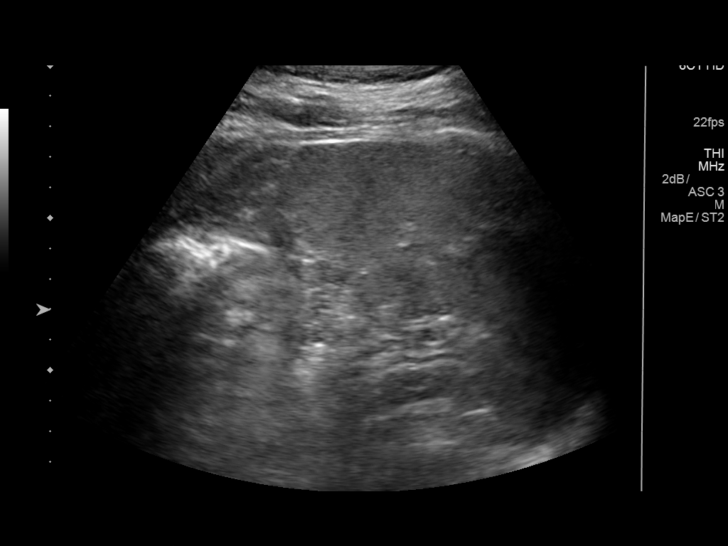
[im 56/84]
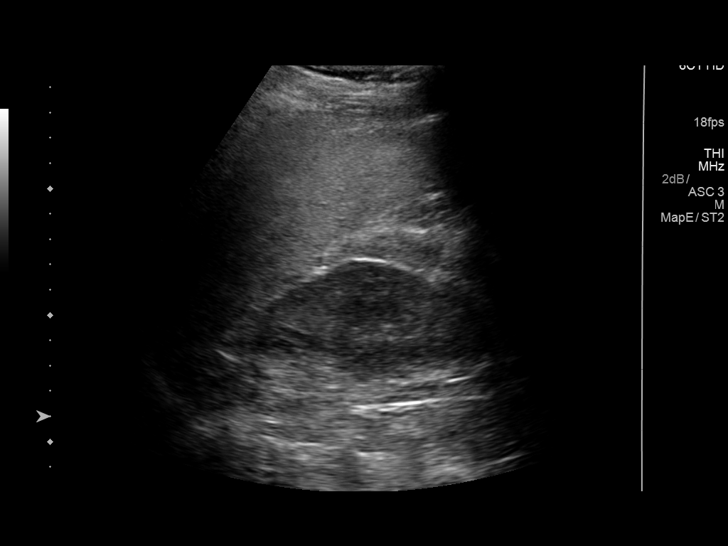
[im 63/84]
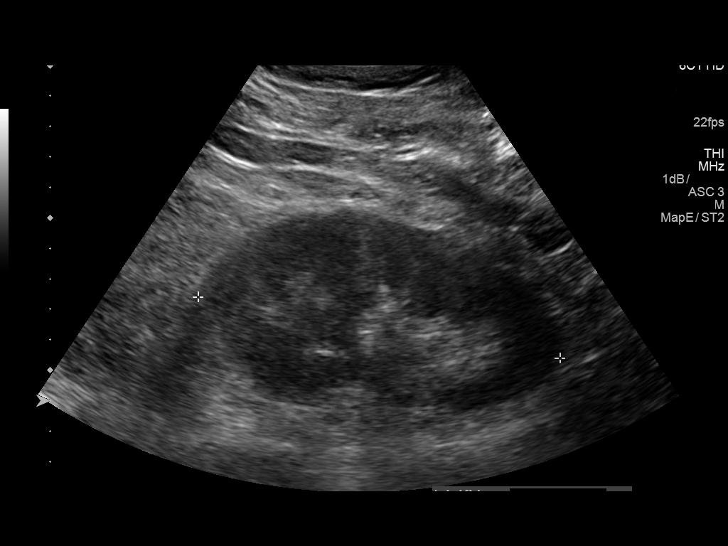
[im 70/84]
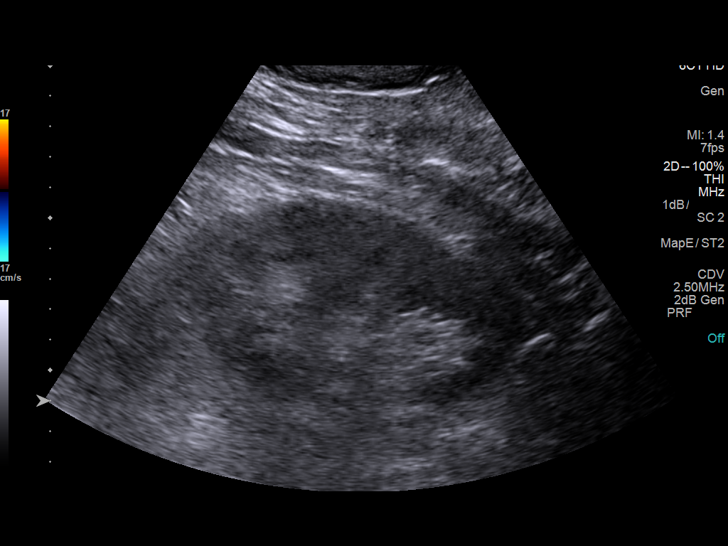
[im 77/84]
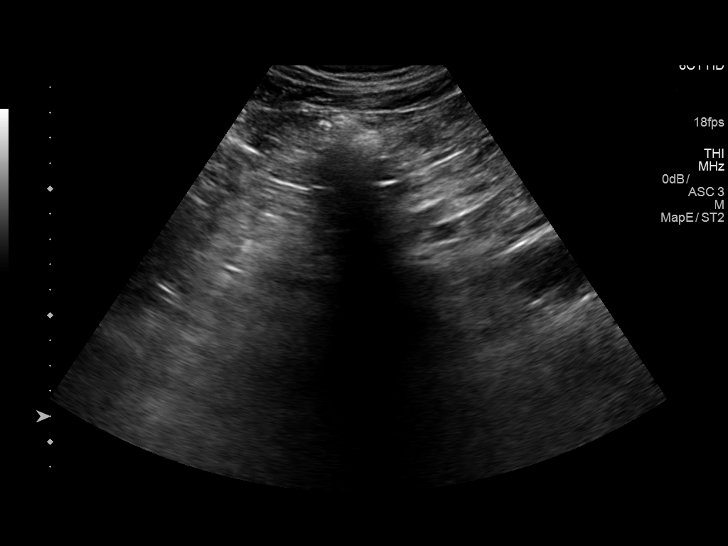
[im 84/84]
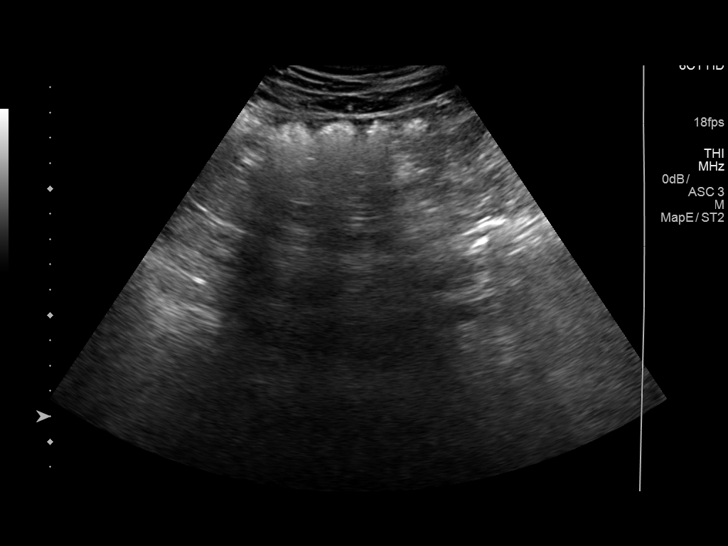

[13 of 25 positions shown; findings below may reference images not displayed]

FINDINGS: Gallbladder: Normally distended without stones or wall thickening.
No pericholecystic fluid or sonographic Murphy sign.

Common bile duct: Diameter: 4 mm diameter, normal

Liver: Inhomogeneous increased echogenicity of the liver question
fatty infiltration though this can be seen with cirrhosis and
certain infiltrative disorders. No definite hepatic mass or
nodularity identified. No intrahepatic biliary dilatation. Portal
vein is patent on color Doppler imaging with normal direction of
blood flow towards the liver.

IVC: Normal appearance

Pancreas: Portions of head and distal tail obscured by bowel gas.
Visualized portion demonstrates heterogeneous echogenicity without
discrete mass

Spleen: Normal appearance, 8.9 cm length

Right Kidney: Length: 11.5 cm. Normal morphology without mass or
hydronephrosis.

Left Kidney: Length: 12.0 cm. Normal morphology without mass or
hydronephrosis.

Abdominal aorta: Visualized portions normal caliber with scattered
atherosclerotic plaque period midportion is cured by bowel gas.
Fusiform dilatation of the distal abdominal aorta 3.4 cm AP.

Other findings: No free fluid
IMPRESSION: Small fusiform aneurysm of the distal abdominal aorta 3.4 cm AP.

Inhomogeneous increased echogenicity of liver question fatty
infiltration versus cirrhosis.

Remainder of exam unremarkable.

## 2020-12-04 ENCOUNTER — Ambulatory Visit (HOSPITAL_COMMUNITY)
Admission: EM | Admit: 2020-12-04 | Discharge: 2020-12-04 | Disposition: A | Discharge status: Home / Self Care | Payer: 59 | Attending: Urgent Care | Admitting: Urgent Care

## 2020-12-04 ENCOUNTER — Other Ambulatory Visit: Payer: Self-pay

## 2020-12-04 ENCOUNTER — Encounter (HOSPITAL_COMMUNITY): Payer: Self-pay | Admitting: Emergency Medicine

## 2020-12-04 DIAGNOSIS — L0291 Cutaneous abscess, unspecified: Secondary | ICD-10-CM

## 2020-12-04 DIAGNOSIS — L03317 Cellulitis of buttock: Secondary | ICD-10-CM

## 2020-12-04 MED ORDER — AMOXICILLIN-POT CLAVULANATE 875-125 MG PO TABS: 1.0000 | ORAL_TABLET | Freq: Two times a day (BID) | ORAL | 0 refills | Status: DC

## 2020-12-04 NOTE — Discharge Instructions (Signed)
Do not apply any ointments or creams. Make sure that you are pressing on the wound to get pus to come out. You can use warm compresses to the area 3-5 times daily, 5-10 minutes at a time. Clean gently around the perimeter of the wound with gentle soap and warm water.  Please just use Tylenol at a dose of 500mg -650mg  once every 6 hours as needed for your aches, pains, fevers. Do not use any nonsteroidal anti-inflammatories (NSAIDs) like ibuprofen, Motrin, naproxen, Aleve, etc. which are all available over-the-counter.

## 2020-12-04 NOTE — ED Triage Notes (Signed)
PT C/O: abscess on left buttocks onset yest associated w/drainage that was reddish...  Pain has subsided after abscess drained yesterday   DENIES: fevers  TAKING MEDS: none  A&O x4... NAD... Ambulatory

## 2020-12-04 NOTE — ED Provider Notes (Signed)
[facility]   MRN: [id number] DOB: 06-20-1956  Subjective:   Ronald Avery is a 64 y.o. male presenting for 2-day history of acute onset buttock pain worse over the left side.  Patient states that he felt a firm knot and last night actually popped and drained.  He continues to have some mild discomfort today.  Denies fever, nausea, vomiting, severe pain, history of abscesses.  Patient is HIV positive, takes Radio producer.  Has not needed take any medications for pain.  Denies a history of difficulty with abscesses and skin infections.  Denies history of herpes.  He does have a history of anal intraepithelial neoplasia.  No current facility-administered medications for this encounter.  Current Outpatient Medications:  .  amLODipine (NORVASC) 10 MG tablet, TAKE 1 TABLET BY MOUTH  DAILY, Disp: 90 tablet, Rfl: 3 .  aspirin 81 MG tablet, Take 81 mg by mouth daily., Disp: , Rfl:  .  bictegravir-emtricitabine-tenofovir AF (BIKTARVY) 50-200-25 MG TABS tablet, Take 1 tablet by mouth daily., Disp: , Rfl:  .  lisinopril (PRINIVIL,ZESTRIL) 10 MG tablet, Take 1 tablet (10 mg total) by mouth daily., Disp: 90 tablet, Rfl: 3 .  VIAGRA 100 MG tablet, Take 100 mg by mouth daily as needed., Disp: , Rfl: 0   No Known Allergies  Past Medical History:  Diagnosis Date  . AIN (anal intraepithelial neoplasia) anal canal   . Anxiety   . 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 CAD (coronary artery disease) 2001   minor- 30-40% RCA  . Hepatitis C   . History of smoking   . HIV (human immunodeficiency virus infection) ([facility])   . HTN (hypertension)   . Hydrocele   . Psoriasis      Past Surgical History:  Procedure Laterality Date  . CARDIAC CATHETERIZATION    . COLONOSCOPY  2018  . KNEE ARTHROSCOPY Right 1973  . TESTICULAR EXPLORATION Left 1882   cyst    Family History  Problem Relation Age of Onset  . Cancer Mother 27       type unknown  . Heart disease Mother   . Cancer Father         blood, type unknown    Social History   Tobacco Use  . Smoking status: Former Smoker    Types: Cigarettes    Quit date: 12/23/1988    Years since quitting: 31.9  . Smokeless tobacco: Never Used  Substance Use Topics  . Alcohol use: Yes    Comment: occasional    ROS   Objective:   Vitals: BP (!) 144/81 (BP Location: Right Arm)   Pulse 64   Temp 98.2 F (36.8 C) (Oral)   Resp 16   SpO2 96%   Physical Exam Constitutional:      General: He is not in acute distress.    Appearance: Normal appearance. He is well-developed and normal weight. He is not ill-appearing, toxic-appearing or diaphoretic.  HENT:     Head: Normocephalic and atraumatic.     Right Ear: External ear normal.     Left Ear: External ear normal.     Nose: Nose normal.     Mouth/Throat:     Pharynx: Oropharynx is clear.  Eyes:     General: No scleral icterus.       Right eye: No discharge.        Left eye: No discharge.     Extraocular Movements: Extraocular movements intact.     Pupils: Pupils are  equal, round, and reactive to light.  Cardiovascular:     Rate and Rhythm: Normal rate.  Pulmonary:     Effort: Pulmonary effort is normal.  Genitourinary:   Musculoskeletal:     Cervical back: Normal range of motion.  Neurological:     Mental Status: He is alert and oriented to person, place, and time.  Psychiatric:        Mood and Affect: Mood normal.        Behavior: Behavior normal.        Thought Content: Thought content normal.        Judgment: Judgment normal.     Assessment and Plan :   PDMP not reviewed this encounter.  1. Cellulitis of buttock   2. Abscess     Given the area involved, start Augmentin.  Counseled on general wound care.  Use supportive care otherwise. Counseled patient on potential for adverse effects with medications prescribed/recommended today, ER and return-to-clinic precautions discussed, patient verbalized understanding.    Wallis Bamberg, PA-C 12/04/20  1036

## 2021-04-21 ENCOUNTER — Encounter (HOSPITAL_COMMUNITY): Payer: Self-pay

## 2021-04-21 ENCOUNTER — Ambulatory Visit (HOSPITAL_COMMUNITY)
Admission: EM | Admit: 2021-04-21 | Discharge: 2021-04-21 | Disposition: A | Discharge status: Home / Self Care | Payer: 59 | Attending: Physician Assistant | Admitting: Physician Assistant

## 2021-04-21 ENCOUNTER — Other Ambulatory Visit: Payer: Self-pay

## 2021-04-21 DIAGNOSIS — J069 Acute upper respiratory infection, unspecified: Secondary | ICD-10-CM

## 2021-04-21 DIAGNOSIS — J9801 Acute bronchospasm: Secondary | ICD-10-CM | POA: Diagnosis not present

## 2021-04-21 MED ORDER — ALBUTEROL SULFATE HFA 108 (90 BASE) MCG/ACT IN AERS: 1.0000 | INHALATION_SPRAY | Freq: Four times a day (QID) | RESPIRATORY_TRACT | 0 refills | Status: DC | PRN

## 2021-04-21 MED ORDER — PREDNISONE 20 MG PO TABS: 40.0000 mg | ORAL_TABLET | Freq: Every day | ORAL | 0 refills | Status: AC

## 2021-04-21 NOTE — Discharge Instructions (Addendum)
We did not test you for COVID-19 given you have had negative at home tests and symptoms have been present for 5 days.  I am concerned that you are having bronchospasms so called in albuterol to be used every 4-6 hours as needed.  I would like you to start prednisone burst (40 mg for 4 days).  You should not take NSAIDs (aspirin, ibuprofen/Advil, naproxen/Aleve) with this medication.  Use a humidifier make sure you are drinking plenty of fluid.  You can continue Mucinex.  If symptoms do not significantly improve within 48 hours please return for reevaluation.

## 2021-04-21 NOTE — ED Provider Notes (Signed)
[facility]    CSN: [id number] Arrival date & time: 04/21/21  1134      History   Chief Complaint Chief Complaint  Patient presents with  . Nasal Congestion  . Sore Throat    itchy    HPI Ronald Avery is a 65 y.o. male.   Patient presents today with a 5-day history of cough.  Reports associated body aches, fatigue, malaise, sore throat, shortness of breath.  He has been taking Mucinex and over-the-counter antihistamines without improvement of symptoms.  Denies any fever, chest pain, nausea, vomiting, dizziness, syncope.  He does have a history of allergies managed with as needed antihistamines.  Denies history of asthma, COPD, or current smoking.  He denies any recent antibiotic use.  Denies any known sick contacts.  Is up-to-date on COVID and influenza vaccinations.  He does have a history of HIV but reports taking antivirals religiously and viral load remains undetectable.  He is having difficulty with daily activities including working as result of symptoms.     Past Medical History:  Diagnosis Date  . AIN (anal intraepithelial neoplasia) anal canal   . Anxiety   . 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 CAD (coronary artery disease) 2001   minor- 30-40% RCA  . Hepatitis C   . History of smoking   . HIV (human immunodeficiency virus infection) ([place])   . HTN (hypertension)   . Hydrocele   . Psoriasis     Patient Active Problem List   Diagnosis Date Noted  . AIN (anal intraepithelial neoplasia) anal canal 12/23/2018  . Piriformis syndrome of right side 08/26/2018  . Human immunodeficiency virus (HIV) disease ([place]) 09/24/2014  . HTN (hypertension)   . CAD (coronary artery disease)   . History of smoking   . Aortic valve sclerosis     Past Surgical History:  Procedure Laterality Date  . CARDIAC CATHETERIZATION    . COLONOSCOPY  2018  . KNEE ARTHROSCOPY Right 1973  . TESTICULAR EXPLORATION Left 1882   cyst       Home Medications    Prior to  Admission medications   Medication Sig Start Date End Date Taking? Authorizing Provider  albuterol (VENTOLIN HFA) 108 (90 Base) MCG/ACT inhaler Inhale 1-2 puffs into the lungs every 6 (six) hours as needed for wheezing or shortness of breath. 04/21/21  Yes Cieanna Stormes K, PA-C  predniSONE (DELTASONE) 20 MG tablet Take 2 tablets (40 mg total) by mouth daily with breakfast for 4 days. 04/21/21 04/25/21 Yes Layliana Devins K, PA-C  amLODipine (NORVASC) 10 MG tablet TAKE 1 TABLET BY MOUTH  DAILY 09/18/16   Troy Sine, MD  amoxicillin-clavulanate (AUGMENTIN) 875-125 MG tablet Take 1 tablet by mouth every 12 (twelve) hours. 12/04/20   Jaynee Eagles, PA-C  aspirin 81 MG tablet Take 81 mg by mouth daily.    [provider]  bictegravir-emtricitabine-tenofovir AF (BIKTARVY) 50-200-25 MG TABS tablet Take 1 tablet by mouth daily. 12/19/18   [provider]  lisinopril (PRINIVIL,ZESTRIL) 10 MG tablet Take 1 tablet (10 mg total) by mouth daily. 09/18/16   Troy Sine, MD  VIAGRA 100 MG tablet Take 100 mg by mouth daily as needed. 03/26/17   [provider]    Family History Family History  Problem Relation Age of Onset  . Cancer Mother 61       type unknown  . Heart disease Mother   . Cancer Father        blood, type  unknown    Social History Social History   Tobacco Use  . Smoking status: Former Smoker    Types: Cigarettes    Quit date: 12/23/1988    Years since quitting: 32.3  . Smokeless tobacco: Never Used  Substance Use Topics  . Alcohol use: Yes    Comment: occasional     Allergies   Patient has no known allergies.   Review of Systems Review of Systems  Constitutional: Negative for activity change, appetite change, fatigue and fever.  HENT: Positive for congestion and sore throat. Negative for sinus pressure and sneezing.   Respiratory: Positive for cough and shortness of breath.   Cardiovascular: Negative for chest pain.  Gastrointestinal: Negative  for abdominal pain, diarrhea, nausea and vomiting.  Musculoskeletal: Negative for arthralgias and myalgias.  Neurological: Negative for dizziness, light-headedness and headaches.     Physical Exam Triage Vital Signs ED Triage Vitals  Enc Vitals Group     BP 04/21/21 1349 132/86     Pulse Rate 04/21/21 1347 72     Resp 04/21/21 1347 18     Temp 04/21/21 1347 98.4 F (36.9 C)     Temp Source 04/21/21 1347 Oral     SpO2 04/21/21 1347 95 %     Weight --      Height --      Head Circumference --      Peak Flow --      Pain Score 04/21/21 1346 0     Pain Loc --      Pain Edu? --      Excl. in [place]? --    No data found.  Updated Vital Signs BP 132/86   Pulse 72   Temp 98.4 F (36.9 C) (Oral)   Resp 18   SpO2 95%   Visual Acuity Right Eye Distance:   Left Eye Distance:   Bilateral Distance:    Right Eye Near:   Left Eye Near:    Bilateral Near:     Physical Exam Vitals reviewed.  Constitutional:      General: He is awake.     Appearance: Normal appearance. He is normal weight. He is not ill-appearing.     Comments: Very pleasant male appears stated age in no acute distress  HENT:     Head: Normocephalic and atraumatic.     Right Ear: Tympanic membrane, ear canal and external ear normal. Tympanic membrane is not erythematous or bulging.     Left Ear: Tympanic membrane, ear canal and external ear normal. Tympanic membrane is not erythematous or bulging.     Nose: Nose normal.     Right Sinus: No maxillary sinus tenderness or frontal sinus tenderness.     Left Sinus: No maxillary sinus tenderness or frontal sinus tenderness.     Mouth/Throat:     Pharynx: Uvula midline. No oropharyngeal exudate or posterior oropharyngeal erythema.  Cardiovascular:     Rate and Rhythm: Normal rate and regular rhythm.     Heart sounds: No murmur heard.   Pulmonary:     Effort: Pulmonary effort is normal. No accessory muscle usage or respiratory distress.     Breath sounds: No  stridor. Wheezing present. No rhonchi or rales.     Comments: Scattered wheezing throughout lung fields. Abdominal:     General: Bowel sounds are normal.     Palpations: Abdomen is soft.     Tenderness: There is no abdominal tenderness.  Lymphadenopathy:     Head:  Right side of head: No submental, submandibular or tonsillar adenopathy.     Left side of head: No submental, submandibular or tonsillar adenopathy.     Cervical: No cervical adenopathy.  Neurological:     Mental Status: He is alert.  Psychiatric:        Behavior: Behavior is cooperative.      UC Treatments / Results  Labs (all labs ordered are listed, but only abnormal results are displayed) Labs Reviewed - No data to display  EKG   Radiology No results found.  Procedures Procedures (including critical care time)  Medications Ordered in UC Medications - No data to display  Initial Impression / Assessment and Plan / UC Course  I have reviewed the triage vital signs and the nursing notes.  Pertinent labs & imaging results that were available during my care of the patient were reviewed by me and considered in my medical decision making (see chart for details).     No evidence of acute infection on physical exam that warrant initiation of antibiotics.  Patient prescribed albuterol with instruction on how to use this medication during office visit today.  Started on prednisone taper with instruction not to take NSAIDs with this medication due to risk of GI bleeding.  He can use Flonase, Mucinex, humidifier for additional symptom relief.  Recommended rest and drinking plenty of fluid.  If he has any worsening symptoms he is to go to the emergency room.  If he does not have improvement within 48 hours recommended he return here for reevaluation.  Strict return precautions given to which patient expressed understanding.  Final Clinical Impressions(s) / UC Diagnoses   Final diagnoses:  Viral URI with cough   Bronchospasm     Discharge Instructions     We did not test you for COVID-19 given you have had negative at home tests and symptoms have been present for 5 days.  I am concerned that you are having bronchospasms so called in albuterol to be used every 4-6 hours as needed.  I would like you to start prednisone burst (40 mg for 4 days).  You should not take NSAIDs (aspirin, ibuprofen/Advil, naproxen/Aleve) with this medication.  Use a humidifier make sure you are drinking plenty of fluid.  You can continue Mucinex.  If symptoms do not significantly improve within 48 hours please return for reevaluation.    ED Prescriptions    Medication Sig Dispense Auth. Provider   albuterol (VENTOLIN HFA) 108 (90 Base) MCG/ACT inhaler Inhale 1-2 puffs into the lungs every 6 (six) hours as needed for wheezing or shortness of breath. 8 g Keonna Raether K, PA-C   predniSONE (DELTASONE) 20 MG tablet Take 2 tablets (40 mg total) by mouth daily with breakfast for 4 days. 8 tablet Jahaira Earnhart, Derry Skill, PA-C     PDMP not reviewed this encounter.   Terrilee Croak, PA-C 04/21/21 1412

## 2021-04-21 NOTE — ED Triage Notes (Signed)
Pt c/o itchy throat, and nasal congestion x 5 days. Pt states he took two at home COVID test that resulted negative.

## 2021-06-21 ENCOUNTER — Encounter (HOSPITAL_COMMUNITY): Payer: Self-pay

## 2021-06-21 ENCOUNTER — Ambulatory Visit (HOSPITAL_COMMUNITY)
Admission: EM | Admit: 2021-06-21 | Discharge: 2021-06-21 | Disposition: A | Discharge status: Home / Self Care | Payer: 59 | Attending: Family Medicine | Admitting: Family Medicine

## 2021-06-21 DIAGNOSIS — K629 Disease of anus and rectum, unspecified: Secondary | ICD-10-CM | POA: Diagnosis not present

## 2021-06-21 MED ORDER — HYDROCORTISONE (PERIANAL) 2.5 % EX CREA: 1.0000 "application " | TOPICAL_CREAM | Freq: Two times a day (BID) | CUTANEOUS | 0 refills | Status: DC

## 2021-06-21 NOTE — ED Triage Notes (Signed)
Pt states he believes he has a rectal abscess, denies pain. States he has had an abscess before. Pt having regular bowel movements. States its been going on for a few days.    No interventions

## 2021-06-21 NOTE — ED Provider Notes (Signed)
[place]    CSN: [id number] Arrival date & time: 06/21/21  1519      History   Chief Complaint Chief Complaint  Patient presents with   Abscess    HPI Ronald Avery is a 65 y.o. male.   Patient presenting today with about 3-day history of some skin changes in the anal area.  He denies any pain, drainage, warmth, bleeding, difficulty or pain with bowel movements, melena.  Has not tried anything over-the-counter for symptoms.  Of note, he has a biopsy scheduled for next month for anal intraepithelial neoplasia per chart review.   Past Medical History:  Diagnosis Date   AIN (anal intraepithelial neoplasia) anal canal    Anxiety    Aortic valve sclerosis 9/13   CAD (coronary artery disease) 2001   minor- 30-40% RCA   Hepatitis C    History of smoking    HIV (human immunodeficiency virus infection) ([place])    HTN (hypertension)    Hydrocele    Psoriasis     Patient Active Problem List   Diagnosis Date Noted   AIN (anal intraepithelial neoplasia) anal canal 12/23/2018   Piriformis syndrome of right side 08/26/2018   Human immunodeficiency virus (HIV) disease ([place]) 09/24/2014   HTN (hypertension)    CAD (coronary artery disease)    History of smoking    Aortic valve sclerosis     Past Surgical History:  Procedure Laterality Date   CARDIAC CATHETERIZATION     COLONOSCOPY  2018   KNEE ARTHROSCOPY Right 1973   TESTICULAR EXPLORATION Left 1882   cyst       Home Medications    Prior to Admission medications   Medication Sig Start Date End Date Taking? Authorizing Provider  hydrocortisone (ANUSOL-HC) 2.5 % rectal cream Place 1 application rectally 2 (two) times daily. 06/21/21  Yes Volney American, PA-C  albuterol (VENTOLIN HFA) 108 (90 Base) MCG/ACT inhaler Inhale 1-2 puffs into the lungs every 6 (six) hours as needed for wheezing or shortness of breath. 04/21/21   Raspet, Derry Skill, PA-C  amLODipine (NORVASC) 10 MG tablet TAKE 1 TABLET BY MOUTH   DAILY 09/18/16   Troy Sine, MD  amoxicillin-clavulanate (AUGMENTIN) 875-125 MG tablet Take 1 tablet by mouth every 12 (twelve) hours. 12/04/20   Jaynee Eagles, PA-C  aspirin 81 MG tablet Take 81 mg by mouth daily.    [provider]  bictegravir-emtricitabine-tenofovir AF (BIKTARVY) 50-200-25 MG TABS tablet Take 1 tablet by mouth daily. 12/19/18   [provider]  lisinopril (PRINIVIL,ZESTRIL) 10 MG tablet Take 1 tablet (10 mg total) by mouth daily. 09/18/16   Troy Sine, MD  VIAGRA 100 MG tablet Take 100 mg by mouth daily as needed. 03/26/17   [provider]    Family History Family History  Problem Relation Age of Onset   Cancer Mother 29       type unknown   Heart disease Mother    Cancer Father        blood, type unknown    Social History Social History   Tobacco Use   Smoking status: Former    Types: Cigarettes    Quit date: 12/23/1988    Years since quitting: 32.5   Smokeless tobacco: Never  Substance Use Topics   Alcohol use: Yes    Comment: occasional   Drug use: Never     Allergies   Patient has no known allergies.   Review of Systems Review of Systems  Per HPI  Physical Exam Triage Vital Signs ED Triage Vitals  Enc Vitals Group     BP 06/21/21 1637 (!) 150/83     Pulse Rate 06/21/21 1637 68     Resp 06/21/21 1637 18     Temp 06/21/21 1637 97.8 F (36.6 C)     Temp Source 06/21/21 1637 Oral     SpO2 06/21/21 1637 98 %     Weight --      Height --      Head Circumference --      Peak Flow --      Pain Score 06/21/21 1636 0     Pain Loc --      Pain Edu? --      Excl. in [place]? --    No data found.  Updated Vital Signs BP (!) 150/83 (BP Location: Left Arm)   Pulse 68   Temp 97.8 F (36.6 C) (Oral)   Resp 18   SpO2 98%   Visual Acuity Right Eye Distance:   Left Eye Distance:   Bilateral Distance:    Right Eye Near:   Left Eye Near:    Bilateral Near:     Physical Exam Vitals and nursing note  reviewed. Exam conducted with a chaperone present.  Constitutional:      Appearance: Normal appearance.  HENT:     Head: Atraumatic.  Eyes:     Extraocular Movements: Extraocular movements intact.     Conjunctiva/sclera: Conjunctivae normal.  Cardiovascular:     Rate and Rhythm: Normal rate and regular rhythm.  Pulmonary:     Effort: Pulmonary effort is normal.     Breath sounds: Normal breath sounds.  Abdominal:     General: Bowel sounds are normal. There is no distension.     Palpations: Abdomen is soft.     Tenderness: There is no abdominal tenderness. There is no guarding.  Genitourinary:    Comments: Area at 8 through 10:00 in anal region of hyperkeratotic tissue, no firm nodule or induration, fluctuance and no erythema.  No bleeding or drainage noted on exam.  Nontender to palpation. Musculoskeletal:        General: Normal range of motion.     Cervical back: Normal range of motion and neck supple.  Skin:    General: Skin is warm and dry.  Neurological:     General: No focal deficit present.     Mental Status: He is oriented to person, place, and time.  Psychiatric:        Mood and Affect: Mood normal.        Thought Content: Thought content normal.        Judgment: Judgment normal.     UC Treatments / Results  Labs (all labs ordered are listed, but only abnormal results are displayed) Labs Reviewed - No data to display  EKG   Radiology No results found.  Procedures Procedures (including critical care time)  Medications Ordered in UC Medications - No data to display  Initial Impression / Assessment and Plan / UC Course  I have reviewed the triage vital signs and the nursing notes.  Pertinent labs & imaging results that were available during my care of the patient were reviewed by me and considered in my medical decision making (see chart for details).     No evidence of infection, bowel movements have been passing normally and without pain, bleeding.   Vital signs benign and reassuring today.  Already has biopsy scheduled  for abnormal cells in this area, unsure if these are new inflammatory changes or related to already known abnormal cells.  Will trial Anusol, gentle wipes and good hygiene in that area.  Discussed to notify provider performing biopsy that he is having some skin changes in the area.  Follow-up with PCP for recheck.  Final Clinical Impressions(s) / UC Diagnoses   Final diagnoses:  Anal lesion   Discharge Instructions   None    ED Prescriptions     Medication Sig Dispense Auth. Provider   hydrocortisone (ANUSOL-HC) 2.5 % rectal cream Place 1 application rectally 2 (two) times daily. 60 g Volney American, [place]      PDMP not reviewed this encounter.   Volney American, [place] 06/21/21 1742

## 2021-06-21 NOTE — ED Notes (Signed)
At bedside for Ronald lane pa examination of patient

## 2021-08-06 ENCOUNTER — Encounter (HOSPITAL_COMMUNITY): Payer: Self-pay | Admitting: *Deleted

## 2021-08-06 ENCOUNTER — Other Ambulatory Visit: Payer: Self-pay

## 2021-08-06 ENCOUNTER — Ambulatory Visit (HOSPITAL_COMMUNITY)
Admission: EM | Admit: 2021-08-06 | Discharge: 2021-08-06 | Disposition: A | Discharge status: Home / Self Care | Payer: 59 | Attending: Emergency Medicine | Admitting: Emergency Medicine

## 2021-08-06 DIAGNOSIS — F4311 Post-traumatic stress disorder, acute: Secondary | ICD-10-CM | POA: Diagnosis not present

## 2021-08-06 DIAGNOSIS — I159 Secondary hypertension, unspecified: Secondary | ICD-10-CM

## 2021-08-06 DIAGNOSIS — I2510 Atherosclerotic heart disease of native coronary artery without angina pectoris: Secondary | ICD-10-CM | POA: Diagnosis not present

## 2021-08-06 MED ORDER — CLONIDINE 0.2 MG/24HR TD PTWK: 0.2000 mg | MEDICATED_PATCH | TRANSDERMAL | 1 refills | Status: AC

## 2021-08-06 MED ORDER — CLONIDINE HCL 0.2 MG PO TABS: 0.2000 mg | ORAL_TABLET | Freq: Three times a day (TID) | ORAL | 0 refills | Status: DC

## 2021-08-06 NOTE — ED Triage Notes (Signed)
Pt reports SBP 166 today and is here for a check.

## 2021-08-06 NOTE — ED Provider Notes (Signed)
[place]    CSN: [id number] Arrival date & time: 08/06/21  [place]      History   Chief Complaint Chief Complaint  Patient presents with   Hypertension    HPI Ronald Avery is a 65 y.o. male.   Patient complains of elevated blood pressure.  Patient has a history of coronary artery disease along with a mild distal fusiform abdominal aortic aneurysm.  Patient's blood pressure today is mildly elevated.  Patient reports increased stress as of late, had to euthanize his dog last week and yesterday found out that his best friend's father died, patient is tearful during visit today.  Patient states he has not been sleeping well for the past several nights, feels this may be contributing to his elevated blood pressure.  Patient denies increased swelling in lower extremities, frank chest pain, acute shortness of breath, shortness of breath at rest.  Patient states he does not have an appointment with his cardiologist until October, states he feels the reason they will see him any sooner is because he has not been attending those appointments regularly, currently seeing his primary care for refills of all of his antihypertensive medications.  Reports cardiac catheterization was done last year and has not been back for follow-up since then.   Hypertension   Past Medical History:  Diagnosis Date   AIN (anal intraepithelial neoplasia) anal canal    Anxiety    Aortic valve sclerosis 9/13   CAD (coronary artery disease) 2001   minor- 30-40% RCA   Hepatitis C    History of smoking    HIV (human immunodeficiency virus infection) ([place])    HTN (hypertension)    Hydrocele    Psoriasis     Patient Active Problem List   Diagnosis Date Noted   AIN (anal intraepithelial neoplasia) anal canal 12/23/2018   Piriformis syndrome of right side 08/26/2018   Human immunodeficiency virus (HIV) disease ([place]) 09/24/2014   HTN (hypertension)    CAD (coronary artery disease)    History of  smoking    Aortic valve sclerosis     Past Surgical History:  Procedure Laterality Date   CARDIAC CATHETERIZATION     COLONOSCOPY  2018   KNEE ARTHROSCOPY Right 1973   TESTICULAR EXPLORATION Left 1882   cyst       Home Medications    Prior to Admission medications   Medication Sig Start Date End Date Taking? Authorizing Provider  cloNIDine (CATAPRES - DOSED IN MG/24 HR) 0.2 mg/24hr patch Place 1 patch (0.2 mg total) onto the skin once a week. 08/06/21 09/05/21 Yes Lynden Oxford, NP  cloNIDine (CATAPRES) 0.2 MG tablet Take 1 tablet (0.2 mg total) by mouth 3 (three) times daily for 90 doses. 08/06/21 09/05/21 Yes Lynden Oxford, NP  albuterol (VENTOLIN HFA) 108 (90 Base) MCG/ACT inhaler Inhale 1-2 puffs into the lungs every 6 (six) hours as needed for wheezing or shortness of breath. 04/21/21   Raspet, Derry Skill, PA-C  amLODipine (NORVASC) 10 MG tablet TAKE 1 TABLET BY MOUTH  DAILY 09/18/16   Troy Sine, MD  amoxicillin-clavulanate (AUGMENTIN) 875-125 MG tablet Take 1 tablet by mouth every 12 (twelve) hours. 12/04/20   Jaynee Eagles, PA-C  aspirin 81 MG tablet Take 81 mg by mouth daily.    [provider]  bictegravir-emtricitabine-tenofovir AF (BIKTARVY) 50-200-25 MG TABS tablet Take 1 tablet by mouth daily. 12/19/18   [provider]  hydrocortisone (ANUSOL-HC) 2.5 % rectal cream Place 1 application rectally 2 (  two) times daily. 06/21/21   Volney American, PA-C  lisinopril (PRINIVIL,ZESTRIL) 10 MG tablet Take 1 tablet (10 mg total) by mouth daily. 09/18/16   Troy Sine, MD  VIAGRA 100 MG tablet Take 100 mg by mouth daily as needed. 03/26/17   [provider]    Family History Family History  Problem Relation Age of Onset   Cancer Mother 2       type unknown   Heart disease Mother    Cancer Father        blood, type unknown    Social History Social History   Tobacco Use   Smoking status: Former    Types: Cigarettes    Quit date:  12/23/1988    Years since quitting: 32.6   Smokeless tobacco: Never  Substance Use Topics   Alcohol use: Yes    Comment: occasional   Drug use: Never     Allergies   Patient has no known allergies.   Review of Systems Review of Systems   Physical Exam Triage Vital Signs ED Triage Vitals  Enc Vitals Group     BP 08/06/21 2020 (!) 157/89     Pulse Rate 08/06/21 2020 77     Resp 08/06/21 2020 20     Temp 08/06/21 2020 98.3 F (36.8 C)     Temp src --      SpO2 08/06/21 2020 97 %     Weight --      Height --      Head Circumference --      Peak Flow --      Pain Score 08/06/21 2022 0     Pain Loc --      Pain Edu? --      Excl. in [place]? --    No data found.  Updated Vital Signs BP (!) 157/89   Pulse 77   Temp 98.3 F (36.8 C)   Resp 20   SpO2 97%   Visual Acuity Right Eye Distance:   Left Eye Distance:   Bilateral Distance:    Right Eye Near:   Left Eye Near:    Bilateral Near:     Physical Exam   UC Treatments / Results  Labs (all labs ordered are listed, but only abnormal results are displayed) Labs Reviewed - No data to display  EKG   Radiology No results found.  Procedures Procedures (including critical care time)  Medications Ordered in UC Medications - No data to display  Initial Impression / Assessment and Plan / UC Course  I have reviewed the triage vital signs and the nursing notes.  Pertinent labs & imaging results that were available during my care of the patient were reviewed by me and considered in my medical decision making (see chart for details).     EKG today is stable.  Will initiate clonidine patch for dual purpose, as an adjunct to current antihypertensive medication as well as to hopefully help the patient sleep a little easier during the stressful time.  Patient advised to follow-up with family friends for support.  Patient advised to keep his appointment with cardiology in October and reach out to them sooner should  his elevated blood pressure persist.  Lysed understanding, all questions addressed. Final Clinical Impressions(s) / UC Diagnoses   Final diagnoses:  Atherosclerosis of native coronary artery of native heart without angina pectoris  Secondary hypertension  Acute posttraumatic stress disorder     Discharge Instructions  EKG today is stable.  Please continue lisinopril and amlodipine as prescribed.  Please keep your appointment with your cardiologist in October.  Please begin clonidine as prescribed for additional blood pressure control.     ED Prescriptions     Medication Sig Dispense Auth. Provider   cloNIDine (CATAPRES) 0.2 MG tablet Take 1 tablet (0.2 mg total) by mouth 3 (three) times daily for 90 doses. 90 tablet Lynden Oxford, NP   cloNIDine (CATAPRES - DOSED IN MG/24 HR) 0.2 mg/24hr patch Place 1 patch (0.2 mg total) onto the skin once a week. 4 patch Lynden Oxford, NP      PDMP not reviewed this encounter.   Lynden Oxford, NP 08/06/21 2122

## 2021-08-06 NOTE — Discharge Instructions (Addendum)
EKG today is stable.  Please continue lisinopril and amlodipine as prescribed.  Please keep your appointment with your cardiologist in October.  Please begin clonidine as prescribed for additional blood pressure control.

## 2021-09-29 ENCOUNTER — Ambulatory Visit: Payer: 59 | Admitting: Cardiovascular Disease

## 2021-09-29 ENCOUNTER — Encounter: Payer: Self-pay | Admitting: Cardiovascular Disease

## 2021-09-29 ENCOUNTER — Other Ambulatory Visit: Payer: Self-pay

## 2021-09-29 DIAGNOSIS — B20 Human immunodeficiency virus [HIV] disease: Secondary | ICD-10-CM

## 2021-09-29 DIAGNOSIS — I10 Essential (primary) hypertension: Secondary | ICD-10-CM | POA: Diagnosis not present

## 2021-09-29 DIAGNOSIS — I2510 Atherosclerotic heart disease of native coronary artery without angina pectoris: Secondary | ICD-10-CM

## 2021-09-29 DIAGNOSIS — F32A Depression, unspecified: Secondary | ICD-10-CM | POA: Diagnosis not present

## 2021-09-29 MED ORDER — LISINOPRIL 30 MG PO TABS: 30.0000 mg | ORAL_TABLET | Freq: Every day | ORAL | 3 refills | Status: AC

## 2021-09-29 NOTE — Progress Notes (Signed)
Patient ID: Ronald Avery, male   DOB: [birth year], 65 y.o.   MRN: [id number]     HPI: Ronald Avery is a 65 y.o. male who presents for a follow-up cardiology evaluation.  I last saw him in October 2018.  Ronald Avery has a history of hypertension. I first saw him in 2001 when he presented with stage II hypertension. He has been on combination therapy since that time. Because of recurrent episodes of chest tightness worrisome for angina I  performed cardiac catheterization on 08/23/2000 which revealed 30-40% mid RCA narrowing and 20% ostial stenosis involving the PDA branch of the right artery artery. He has been treated medically. His last nuclear perfusion study was in August 2008 which continued to show normal perfusion.  He has been on combination therapy for hypertension. He has had mildly elevated cholesterol levels in the in the past but these have significantly improved with aggressive diet and exercise.  When I saw him in 2014, he was on amlodipine 5 mg, metoprolol XL 25 mg, Ramipril 10 mg, and hydrochlorothiazide 12.5 mg daily for blood pressure control.  His blood pressure was stable.  He tested positive for HIV and when l saw him in 2015 he was on triple drug therapy and is followed in infectious disease clinic at [facility].  In 2015 he was more fatigued  He was more fatigued and he decided to stop 3 of his blood pressure medications and, consequently, he's only been taking amlodipine 5 mg along with his aspirin and Atripla.  He stopped hydrochlorothiazide, Toprol-XL, and Ramipril.  When I saw him in the office, he had stage II hypertension despite taking amlodipine 5 mg and I titrated this to 10 mg and started him back on lisinopril.  I last saw him in October 2018 and over the previous 3 years he completed a master's degree in social work and graduated in May 2017.  However, he currently is working 3 days per week at the [employer].  He  remained active and exercises fairly  regularly but is not been running as much as he had in the past.  He has continued to be on amlodipine 10 mg, and lisinopril 10 mg for hypertension.  He was on descovy in addition to dolutegravir.  He is followed closely at [facility] infectious disease and has negative HIV virus.  He denies any chest pain.  He denies any palpitations.  Laboratory was done at [facility] on 07/07/2017.  Total cholesterol was 172, triglycerides 153, HDL 74, and LDL 94.  Serum creatinine is 1.03 with a glucose of 83.  LFTs were normal.   Since I last saw him in 2018, he states he had some issues with depression and is on antidepressant medication.  He also admitted to weight gain during the [place] pandemic and was not going to the gym to exercise.  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  Recently he has noticed blood pressure elevation and had been evaluated in urgent care where his initial blood pressure at home was 170 and upon arrival was 150.  He would like to start exercising.  However he admits that he is out of shape.  He has not had recent laboratory.  He has been on a medical regimen consisting of amlodipine 10 mg, lisinopril 20 mg which had recently been increased from 10 mg, and had previously been on clonidine which she is no longer taking.  He is on sertraline  50 mg for depression.  He also takes amitriptyline 10 mg at bedtime.  He continues to be on Biktarvy for his HIV which is controlled.  He presents for reevaluation.  Past Medical History:  Diagnosis Date   AIN (anal intraepithelial neoplasia) anal canal    Anxiety    Aortic valve sclerosis 9/13   CAD (coronary artery disease) 2001   minor- 30-40% RCA   Hepatitis C    History of smoking    HIV (human immunodeficiency virus infection) ([place])    HTN (hypertension)    Hydrocele    Psoriasis     Past Surgical History:  Procedure Laterality Date   CARDIAC CATHETERIZATION     COLONOSCOPY  2018   KNEE ARTHROSCOPY Right 1973   TESTICULAR  EXPLORATION Left 1882   cyst    No Known Allergies  Current Outpatient Medications  Medication Sig Dispense Refill   amitriptyline (ELAVIL) 10 MG tablet Take 10 mg by mouth at bedtime.     amLODipine (NORVASC) 10 MG tablet TAKE 1 TABLET BY MOUTH  DAILY 90 tablet 3   aspirin 81 MG tablet Take 81 mg by mouth daily.     bictegravir-emtricitabine-tenofovir AF (BIKTARVY) 50-200-25 MG TABS tablet Take 1 tablet by mouth daily.     rosuvastatin (CRESTOR) 5 MG tablet Take 5 mg by mouth daily.     sertraline (ZOLOFT) 50 MG tablet Take 1 tablet by mouth daily.     albuterol (VENTOLIN HFA) 108 (90 Base) MCG/ACT inhaler Inhale 1-2 puffs into the lungs every 6 (six) hours as needed for wheezing or shortness of breath. (Patient not taking: Reported on 09/29/2021) 8 g 0   amoxicillin-clavulanate (AUGMENTIN) 875-125 MG tablet Take 1 tablet by mouth every 12 (twelve) hours. (Patient not taking: Reported on 09/29/2021) 20 tablet 0   cloNIDine (CATAPRES) 0.2 MG tablet Take 1 tablet (0.2 mg total) by mouth 3 (three) times daily for 90 doses. (Patient not taking: Reported on 09/29/2021) 90 tablet 0   hydrocortisone (ANUSOL-HC) 2.5 % rectal cream Place 1 application rectally 2 (two) times daily. (Patient not taking: Reported on 09/29/2021) 60 g 0   lisinopril (ZESTRIL) 30 MG tablet Take 1 tablet (30 mg total) by mouth daily. 90 tablet 3   VIAGRA 100 MG tablet Take 100 mg by mouth daily as needed. (Patient not taking: Reported on 09/29/2021)  0   No current facility-administered medications for this visit.    Social History   Socioeconomic History   Marital status: Divorced    Spouse name: Not on file   Number of children: 2   Years of education: Not on file   Highest education level: Not on file  Occupational History   Occupation: [employer]  Tobacco Use   Smoking status: Former    Types: Cigarettes    Quit date: 12/23/1988    Years since quitting: 32.7   Smokeless tobacco: Never  Substance and Sexual  Activity   Alcohol use: Yes    Comment: occasional   Drug use: Never   Sexual activity: Not on file  Other Topics Concern   Not on file  Social History Narrative   Retired from [employer]   Works part-time at [employer]   Divorced, 1 son 1 daughter   Male partners after divorce   EtOH is rarae max 1/week   No tobacco (former smoker)   No drugs   Social Determinants of Radio broadcast assistant Strain: Not on file  Food Insecurity:  Not on file  Transportation Needs: Not on file  Physical Activity: Not on file  Stress: Not on file  Social Connections: Not on file  Intimate Partner Violence: Not on file    Family History  Problem Relation Age of Onset   Cancer Mother 24       type unknown   Heart disease Mother    Cancer Father        blood, type unknown   Socially he is divorced and has 2 children. Remote tobacco history but he quit many years ago. Does drink occasional alcohol. He does exercise.   ROS General: Negative; No fevers, chills, or night sweats; positive for fatigue HEENT: Negative; No changes in vision or hearing, sinus congestion, difficulty swallowing Pulmonary: Negative; No cough, wheezing, shortness of breath, hemoptysis Cardiovascular: Negative; No chest pain, presyncope, syncope, palpitations GI: Negative; No nausea, vomiting, diarrhea, or abdominal pain GU: Negative; No dysuria, hematuria, or difficulty voiding Musculoskeletal: Negative; no myalgias, joint pain, or weakness Hematologic/Oncology: Positive for HIV; no easy bruising, bleeding Endocrine: Negative; no heat/cold intolerance; no diabetes Neuro: Negative; no changes in balance, headaches Skin: Negative; No rashes or skin lesions Psychiatric: Positive for depression Sleep: Negative; No snoring, daytime sleepiness, hypersomnolence, bruxism, restless legs, hypnogognic hallucinations, no cataplexy Other comprehensive 14 point system review is negative.  PE BP 138/80 (BP  Location: Left Arm, Patient Position: Sitting, Cuff Size: Large)   Pulse 68   Ht 6' (1.829 m)   Wt 220 lb 6.4 oz (100 kg)   SpO2 95%   BMI 29.89 kg/m    Repeat blood pressure by me was 150/80  Wt Readings from Last 3 Encounters:  09/29/21 220 lb 6.4 oz (100 kg)  12/23/18 217 lb 6 oz (98.6 kg)  08/26/18 190 lb (86.2 kg)   General: Alert, oriented, no distress.  Skin: normal turgor, no rashes, warm and dry HEENT: Normocephalic, atraumatic. Pupils equal round and reactive to light; sclera anicteric; extraocular muscles intact; Fundi ** Nose without nasal septal hypertrophy Mouth/Parynx benign; Mallinpatti scale Neck: No JVD, no carotid bruits; normal carotid upstroke Lungs: clear to ausculatation and percussion; no wheezing or rales Chest wall: without tenderness to palpitation Heart: PMI not displaced, RRR, s1 s2 normal, 2/6 systolic murmur, no diastolic murmur, no rubs, gallops, thrills, or heaves Abdomen: soft, nontender; no hepatosplenomehaly, BS+; abdominal aorta nontender and not dilated by palpation. Back: no CVA tenderness Pulses 2+ Musculoskeletal: full range of motion, normal strength, no joint deformities Extremities: no clubbing cyanosis or edema, Homan's sign negative  Neurologic: grossly nonfocal; Cranial nerves grossly wnl Psychologic: Normal mood and affect   October 24, 2022ECG (independently read by me): NSR with PAC at 68; Borderline 1st degree AV block; PR 204 msec  October 2018 ECG (independently read by me): Sinus bradycardia at 59 bpm.  First degree AV block; PR interval 222 ms.  No ST segment changes.  October 2017 ECG (independently read by me): Normal sinus rhythm at 65 bpm.  First degree AV block with a PR interval at 216 ms.  October 2015 ECG (independently read by me): Normal sinus rhythm at 73 beats per minute.  Small Q-wave in lead 3.  No significant ST-T changes.  September 2014 ECG: Normal sinus rhythm at 61 beats per minute. Very mild  first-degree AV block with PR 208 ms. Normal QTc interval.  LABS: BMP Latest Ref Rng & Units 04/10/2017 10/08/2014  Glucose 65 - 99 mg/dL 92 101(H)  BUN 6 - 20 mg/dL 15 18  Creatinine 0.61 - 1.24 mg/dL 1.08 0.79  Sodium 135 - 145 mmol/L 137 139  Potassium 3.5 - 5.1 mmol/L 3.4(L) 4.5  Chloride 101 - 111 mmol/L 103 104  CO2 22 - 32 mmol/L 26 25  Calcium 8.9 - 10.3 mg/dL 8.9 8.7   Hepatic Function Latest Ref Rng & Units 12/23/2018 04/10/2017 10/08/2014  Total Protein 6.0 - 8.3 g/dL 7.7 7.5 6.6  Albumin 3.5 - 5.2 g/dL 4.6 4.1 4.0  AST 0 - 37 U/L _0 ALT 0 - 53 U/L 40 30 21  Alk Phosphatase 39 - 117 U/L 59 70 82  Total Bilirubin 0.2 - 1.2 mg/dL 0.6 0.9 0.3  Bilirubin, Direct 0.0 - 0.3 mg/dL 0.1 - -   CBC Latest Ref Rng & Units 04/10/2017 10/08/2014  WBC 4.0 - 10.5 K/uL 11.1(H) 4.7  Hemoglobin 13.0 - 17.0 g/dL 16.3 15.0  Hematocrit 39.0 - 52.0 % 46.1 42.5  Platelets 150 - 400 K/uL 264 244   Lab Results  Component Value Date   MCV 87.5 04/10/2017   MCV 87.6 10/08/2014   Lab Results  Component Value Date   TSH 2.978 10/08/2014   Lipid Panel     Component Value Date/Time   CHOL 184 10/08/2014 0813   TRIG 72 10/08/2014 0813   HDL 61 10/08/2014 0813   CHOLHDL 3.0 10/08/2014 0813   VLDL 14 10/08/2014 0813   LDLCALC 109 (H) 10/08/2014 0813    IMPRESSION: 1. Coronary artery disease involving native coronary artery of native heart without angina pectoris   2. Primary hypertension   3. Human immunodeficiency virus (HIV) disease ([place])   4. Depression, unspecified depression type      ASSESSMENT AND PLAN: Ronald Avery is a 65 year old Caucasian male who has a long-standing history of hypertension and when I first saw him in 2001 he presented with stage II hypertension.  He was treated initially and stabilized with  a 4 drug regimen, consisting of amlodipine, beta blocker therapy, ACE inhibition, and low-dose diuretic therapy.  A cardiac catheterization for chest pain in 2003,  showed mild, nonobstructive CAD.  He remotely had smoked cigarettes but quit tobacco use. He tested positive for HIV and has been on multiple drug therapy for several years with stability.  Most recently, he has been on a medical regimen of amlodipine 10 mg, lisinopril 10 mg, and recently the lisinopril dose was further increased to 20 mg.  At one point he also was on clonidine which he is no longer taking.  His blood pressure today is still elevated by current guideline criteria.  I have recommended further titration of lisinopril to 30 mg daily with target blood pressure less than 130/80.  If blood pressure continues to be in the upper 130s to 140s further titration to 40 mg will be recommended.  He has not been exercising over the past several years particularly during the [place] pandemic.  Remotely, he had mild nonobstructive CAD.  I have recommended to take baby aspirin 81 mg.  To provide some reassurance for return to exercise I have recommended he undergo a routine graded exercise treadmill study for further assessment.  I am also recommending a 2D echo Doppler study to assess systolic and diastolic function as well as valvular architecture.  I will see him in 2 to 3 months for follow-up evaluation or sooner as needed.    By his report this is very stable after being on triple drug therapy for several years and now on 2  drug therapy.  When I saw him 3 years ago.  He again presented with stage II hypertension after stopping 3 Ms. for medications.  Recently, his blood pressure is stabilized on his current regimen consisting of lisinopril 10 mg and amlodipine 10 mg.  Blood pressure today is stable and a repeat by me was 130/80.  His ECG reveals mild sinus bradycardia 59 bpm with stable first-degree AV block.  His HIV is controlled with undetectable virus on therapy.  I encouraged him to continue exercise.  As long as he is stable,  willl see him in one year for reevaluation.   Troy Sine, MD, [facility]   10/01/2021 7:06 PM

## 2021-09-29 NOTE — Patient Instructions (Addendum)
Medication Instructions:  TAKE LISINOPRIL 30 MG ONCE A DAY *If you need a refill on your cardiac medications before your next appointment, please call your pharmacy*   Lab Work: PLEASE RETURN FOR FASTING LAB WORK IN 1 MONTH ( CMET, CBC, LIPIDS, TSH) If you have labs (blood work) drawn today and your tests are completely normal, you will receive your results only by: [place] (if you have MyChart) OR A paper copy in the mail If you have any lab test that is abnormal or we need to change your treatment, we will call you to review the results.   Testing/Procedures: Your physician has requested that you have an echocardiogram. Echocardiography is a painless test that uses sound waves to create images of your heart. It provides your doctor with information about the size and shape of your heart and how well your heart's chambers and valves are working. This procedure takes approximately one hour. There are no restrictions for this procedure. [place] has requested that you have an exercise tolerance test. For further information please visit [email or web address]. Please also follow instruction sheet, as given.   Follow-Up: At [facility], you and your health needs are our priority.  As part of our continuing mission to provide you with exceptional heart care, we have created designated Provider Care Teams.  These Care Teams include your primary Cardiologist (physician) and Advanced Practice Providers (APPs -  Physician Assistants and Nurse Practitioners) who all work together to provide you with the care you need, when you need it.  We recommend signing up for the patient portal called "MyChart".  Sign up information is provided on this After Visit Summary.  MyChart is used to connect with patients for Virtual Visits (Telemedicine).  Patients are able to view lab/test results, encounter notes, upcoming appointments, etc.  Non-urgent messages can be  sent to your provider as well.   To learn more about what you can do with MyChart, go to [email or web address].    Your next appointment:   2 month(s) DECEMBER  The format for your next appointment:   In Person  Provider:   Shelva Majestic, MD

## 2021-10-01 ENCOUNTER — Encounter: Payer: Self-pay | Admitting: Cardiovascular Disease

## 2021-10-09 ENCOUNTER — Ambulatory Visit (HOSPITAL_COMMUNITY): Payer: 59 | Attending: Cardiovascular Disease

## 2021-10-09 ENCOUNTER — Other Ambulatory Visit: Payer: Self-pay

## 2021-10-09 ENCOUNTER — Ambulatory Visit (INDEPENDENT_AMBULATORY_CARE_PROVIDER_SITE_OTHER): Payer: 59

## 2021-10-09 DIAGNOSIS — I2510 Atherosclerotic heart disease of native coronary artery without angina pectoris: Secondary | ICD-10-CM | POA: Diagnosis present

## 2021-10-09 LAB — EXERCISE TOLERANCE TEST
Angina Index: 0
Base ST Depression (mm): 0 mm
Duke Treadmill Score: 6
Estimated workload: 7
Exercise duration (min): 6 min
Exercise duration (sec): 0 s
MPHR: 154 {beats}/min
Peak HR: 144 {beats}/min
Percent HR: 93 %
RPE: 16
Rest HR: 68 {beats}/min
ST Depression (mm): 0 mm

## 2021-10-10 LAB — ECHOCARDIOGRAM COMPLETE
Area-P 1/2: 3.72 cm2
S' Lateral: 2.9 cm

## 2021-11-28 ENCOUNTER — Other Ambulatory Visit: Payer: Self-pay

## 2021-11-28 ENCOUNTER — Ambulatory Visit (INDEPENDENT_AMBULATORY_CARE_PROVIDER_SITE_OTHER): Payer: 59 | Admitting: Cardiovascular Disease

## 2021-11-28 ENCOUNTER — Encounter: Payer: Self-pay | Admitting: Cardiovascular Disease

## 2021-11-28 VITALS — BP 120/80 | HR 64 | Ht 72.0 in | Wt 223.2 lb

## 2021-11-28 DIAGNOSIS — E785 Hyperlipidemia, unspecified: Secondary | ICD-10-CM | POA: Diagnosis not present

## 2021-11-28 DIAGNOSIS — F32A Depression, unspecified: Secondary | ICD-10-CM

## 2021-11-28 DIAGNOSIS — I2510 Atherosclerotic heart disease of native coronary artery without angina pectoris: Secondary | ICD-10-CM | POA: Diagnosis not present

## 2021-11-28 DIAGNOSIS — I10 Essential (primary) hypertension: Secondary | ICD-10-CM | POA: Diagnosis not present

## 2021-11-28 DIAGNOSIS — B20 Human immunodeficiency virus [HIV] disease: Secondary | ICD-10-CM | POA: Diagnosis not present

## 2021-11-28 DIAGNOSIS — I358 Other nonrheumatic aortic valve disorders: Secondary | ICD-10-CM

## 2021-11-28 NOTE — Progress Notes (Signed)
Patient ID: Ronald Avery, male   DOB: [birth year], 65 y.o.   MRN: [id number]     HPI: Ronald Avery is a 65 y.o. male who presents for a 2 month follow-up cardiology evaluation.  I  Ronald Avery has a history of hypertension. I first saw him in 2001 when he presented with stage II hypertension. He has been on combination therapy since that time. Because of recurrent episodes of chest tightness worrisome for angina I  performed cardiac catheterization on 08/23/2000 which revealed 30-40% mid RCA narrowing and 20% ostial stenosis involving the PDA branch of the right artery artery. He has been treated medically. His last nuclear perfusion study was in August 2008 which continued to show normal perfusion.  He has been on combination therapy for hypertension. He has had mildly elevated cholesterol levels in the in the past but these have significantly improved with aggressive diet and exercise.  When I saw him in 2014, he was on amlodipine 5 mg, metoprolol XL 25 mg, Ramipril 10 mg, and hydrochlorothiazide 12.5 mg daily for blood pressure control.  His blood pressure was stable.  He tested positive for HIV and when l saw him in 2015 he was on triple drug therapy and is followed in infectious disease clinic at [facility].  In 2015 he was more fatigued  He was more fatigued and he decided to stop 3 of his blood pressure medications and, consequently, he's only been taking amlodipine 5 mg along with his aspirin and Atripla.  He stopped hydrochlorothiazide, Toprol-XL, and Ramipril.  When I saw him in the office, he had stage II hypertension despite taking amlodipine 5 mg and I titrated this to 10 mg and started him back on lisinopril.  I saw him in October 2018 and over the previous 3 years he completed a master's degree in social work and graduated in May 2017.  However, he currently is working 3 days per week at the [employer].  He  remained active and exercises fairly regularly but is not been  running as much as he had in the past.  He has continued to be on amlodipine 10 mg, and lisinopril 10 mg for hypertension.  He was on descovy in addition to dolutegravir.  He is followed closely at [facility] infectious disease and has negative HIV virus.  He denies any chest pain.  He denies any palpitations.  Laboratory was done at [facility] on 07/07/2017.  Total cholesterol was 172, triglycerides 153, HDL 74, and LDL 94.  Serum creatinine is 1.03 with a glucose of 83.  LFTs were normal.   After not having seen him since 2018, I saw him September 29, 2021 for follow-up evaluation.  Since his prior evaluation he had some issues with depression and is on antidepressant medication.  He also admitted to weight gain during the [place] pandemic and was not going to the gym to exercise.  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  Recently he has noticed blood pressure elevation and had been evaluated in urgent care where his initial blood pressure at home was 170 and upon arrival was 150.  He would like to start exercising.  However he admits that he is out of shape.  He has not had recent laboratory.  He has been on a medical regimen consisting of amlodipine 10 mg, lisinopril 20 mg which had recently been increased from 10 mg, and had previously been on clonidine which she is no  longer taking.  He is on sertraline 50 mg for depression.  He also takes amitriptyline 10 mg at bedtime.  He continues to be on Biktarvy for his HIV which is controlled.  In that evaluation, with his blood pressure elevation I recommended titration of lisinopril to 30 mg with close blood pressure monitoring.  I also recommended he undergo fasting laboratory.  Since 2019, his weight had increased from 190 pounds when he was exercising regularly to 220 pounds.  In order to provide him some reassurance prior to resuming more intense exercise I recommended a routine graded exercise treadmill study and echocardiography.  An echo  Doppler study in October 09, 2021 showed normal LV function with EF 60 to 65%.  There was mild aortic sclerosis without stenosis.  There was mild dilation of his aortic root at 42 mm and his ascending aorta measured 37 mm.  Graded exercise treadmill study was negative for ischemia and he exercised to a 7 met workload and achieved a peak heart rate of 144, representing 93% of predicted maximum.  There was no chest pain or ECG changes.  Presently, Ronald Avery feels well.  He denies any exertional precipitated chest pain.  At times he has noted some dyspeptic type symptoms.  He is scheduled to undergo elective hernia surgery at [facility] on January 4.  He never went to the laboratory in the fasting state and as result fasting labs have not been done.  He denies palpitations.  He states his blood pressure has improved with his increase lisinopril.  He presents for evaluation.  Past Medical History:  Diagnosis Date   AIN (anal intraepithelial neoplasia) anal canal    Anxiety    Aortic valve sclerosis 9/13   CAD (coronary artery disease) 2001   minor- 30-40% RCA   Hepatitis C    History of smoking    HIV (human immunodeficiency virus infection) ([place])    HTN (hypertension)    Hydrocele    Psoriasis     Past Surgical History:  Procedure Laterality Date   CARDIAC CATHETERIZATION     COLONOSCOPY  2018   KNEE ARTHROSCOPY Right 1973   TESTICULAR EXPLORATION Left 1882   cyst    No Known Allergies  Current Outpatient Medications  Medication Sig Dispense Refill   amitriptyline (ELAVIL) 10 MG tablet Take 10 mg by mouth at bedtime.     amLODipine (NORVASC) 10 MG tablet TAKE 1 TABLET BY MOUTH  DAILY 90 tablet 3   aspirin 81 MG tablet Take 81 mg by mouth daily.     bictegravir-emtricitabine-tenofovir AF (BIKTARVY) 50-200-25 MG TABS tablet Take 1 tablet by mouth daily.     lisinopril (ZESTRIL) 30 MG tablet Take 1 tablet (30 mg total) by mouth daily. 90 tablet 3   rosuvastatin (CRESTOR) 5 MG  tablet Take 5 mg by mouth daily.     sertraline (ZOLOFT) 50 MG tablet Take 1 tablet by mouth daily.     VIAGRA 100 MG tablet Take 100 mg by mouth daily as needed.  0   No current facility-administered medications for this visit.    Social History   Socioeconomic History   Marital status: Divorced    Spouse name: Not on file   Number of children: 2   Years of education: Not on file   Highest education level: Not on file  Occupational History   Occupation: [employer]  Tobacco Use   Smoking status: Former    Types: Cigarettes    Quit date:  12/23/1988    Years since quitting: 32.9   Smokeless tobacco: Never  Substance and Sexual Activity   Alcohol use: Yes    Comment: occasional   Drug use: Never   Sexual activity: Not on file  Other Topics Concern   Not on file  Social History Narrative   Retired from [employer]   Works part-time at [employer], 1 son 1 daughter   Male partners after divorce   EtOH is rarae max 1/week   No tobacco (former smoker)   No drugs   Social Determinants of Radio broadcast assistant Strain: Not on file  Food Insecurity: Not on file  Transportation Needs: Not on file  Physical Activity: Not on file  Stress: Not on file  Social Connections: Not on file  Intimate Partner Violence: Not on file    Family History  Problem Relation Age of Onset   Cancer Mother 16       type unknown   Heart disease Mother    Cancer Father        blood, type unknown   Socially he is divorced and has 2 children. Remote tobacco history but he quit many years ago. Does drink occasional alcohol. He does exercise.   ROS General: Negative; No fevers, chills, or night sweats; positive for fatigue HEENT: Negative; No changes in vision or hearing, sinus congestion, difficulty swallowing Pulmonary: Negative; No cough, wheezing, shortness of breath, hemoptysis Cardiovascular: Negative; No chest pain, presyncope, syncope, palpitations GI: Negative;  No nausea, vomiting, diarrhea, or abdominal pain GU: Negative; No dysuria, hematuria, or difficulty voiding Musculoskeletal: Negative; no myalgias, joint pain, or weakness Hematologic/Oncology: Positive for HIV; no easy bruising, bleeding Endocrine: Negative; no heat/cold intolerance; no diabetes Neuro: Negative; no changes in balance, headaches Skin: Negative; No rashes or skin lesions Psychiatric: Positive for depression Sleep: Negative; No snoring, daytime sleepiness, hypersomnolence, bruxism, restless legs, hypnogognic hallucinations, no cataplexy Other comprehensive 14 point system review is negative.  PE BP 120/80    Pulse 64    Ht 6' (1.829 m)    Wt 223 lb 3.2 oz (101.2 kg)    SpO2 98%    BMI 30.27 kg/m    Repeat blood pressure by me was 122/80.  Wt Readings from Last 3 Encounters:  11/28/21 223 lb 3.2 oz (101.2 kg)  09/29/21 220 lb 6.4 oz (100 kg)  12/23/18 217 lb 6 oz (98.6 kg)    General: Alert, oriented, no distress.  Skin: normal turgor, no rashes, warm and dry HEENT: Normocephalic, atraumatic. Pupils equal round and reactive to light; sclera anicteric; extraocular muscles intact;  Nose without nasal septal hypertrophy Mouth/Parynx benign; Mallinpatti scale 2 Neck: No JVD, no carotid bruits; normal carotid upstroke Lungs: clear to ausculatation and percussion; no wheezing or rales Chest wall: without tenderness to palpitation Heart: PMI not displaced, RRR, s1 s2 normal, 2/6 systolic murmur, no diastolic murmur, no rubs, gallops, thrills, or heaves Abdomen: soft, nontender; no hepatosplenomehaly, BS+; abdominal aorta nontender and not dilated by palpation. Back: no CVA tenderness Pulses 2+ Musculoskeletal: full range of motion, normal strength, no joint deformities Extremities: no clubbing cyanosis or edema, Homan's sign negative  Neurologic: grossly nonfocal; Cranial nerves grossly wnl Psychologic: Normal mood and affect  November 28, 2021 ECG (independently read  by me): Normal sinus rhythm at 64 bpm, isolated PAC, first-degree AV block with a PR interval at '2 1 8 ' ms.  September 29, 2021 ECG (independently read by me): NSR with  PAC at 43; Borderline 1st degree AV block; PR 204 msec  October 2018 ECG (independently read by me): Sinus bradycardia at 59 bpm.  First degree AV block; PR interval 222 ms.  No ST segment changes.  October 2017 ECG (independently read by me): Normal sinus rhythm at 65 bpm.  First degree AV block with a PR interval at 216 ms.  October 2015 ECG (independently read by me): Normal sinus rhythm at 73 beats per minute.  Small Q-wave in lead 3.  No significant ST-T changes.  September 2014 ECG: Normal sinus rhythm at 61 beats per minute. Very mild first-degree AV block with PR 208 ms. Normal QTc interval.  LABS: BMP Latest Ref Rng & Units 04/10/2017 10/08/2014  Glucose 65 - 99 mg/dL 92 101(H)  BUN 6 - 20 mg/dL 15 18  Creatinine 0.61 - 1.24 mg/dL 1.08 0.79  Sodium 135 - 145 mmol/L 137 139  Potassium 3.5 - 5.1 mmol/L 3.4(L) 4.5  Chloride 101 - 111 mmol/L 103 104  CO2 22 - 32 mmol/L 26 25  Calcium 8.9 - 10.3 mg/dL 8.9 8.7   Hepatic Function Latest Ref Rng & Units 12/23/2018 04/10/2017 10/08/2014  Total Protein 6.0 - 8.3 g/dL 7.7 7.5 6.6  Albumin 3.5 - 5.2 g/dL 4.6 4.1 4.0  AST 0 - 37 U/L '31 26 19  ' ALT 0 - 53 U/L 40 30 21  Alk Phosphatase 39 - 117 U/L 59 70 82  Total Bilirubin 0.2 - 1.2 mg/dL 0.6 0.9 0.3  Bilirubin, Direct 0.0 - 0.3 mg/dL 0.1 - -   CBC Latest Ref Rng & Units 04/10/2017 10/08/2014  WBC 4.0 - 10.5 K/uL 11.1(H) 4.7  Hemoglobin 13.0 - 17.0 g/dL 16.3 15.0  Hematocrit 39.0 - 52.0 % 46.1 42.5  Platelets 150 - 400 K/uL 264 244   Lab Results  Component Value Date   MCV 87.5 04/10/2017   MCV 87.6 10/08/2014   Lab Results  Component Value Date   TSH 2.978 10/08/2014   Lipid Panel     Component Value Date/Time   CHOL 184 10/08/2014 0813   TRIG 72 10/08/2014 0813   HDL 61 10/08/2014 0813   CHOLHDL 3.0 10/08/2014 0813    VLDL 14 10/08/2014 0813   LDLCALC 109 (H) 10/08/2014 0813    IMPRESSION: 1. Coronary artery disease involving native coronary artery of native heart without angina pectoris   2. Primary hypertension   3. Hyperlipidemia with target LDL less than 70   4. Human immunodeficiency virus (HIV) disease ([place])   5. Depression, unspecified depression type   6. Aortic valve sclerosis     ASSESSMENT AND PLAN: Ronald Avery is a 65 year old male who has a long-standing history of hypertension and when I first saw him in 2001 he presented with stage II hypertension.  He was treated initially and stabilized with  a 4 drug regimen, consisting of amlodipine, beta blocker therapy, ACE inhibition, and low-dose diuretic therapy.  A cardiac catheterization for chest pain in 2003, showed mild, nonobstructive CAD.  He remotely had smoked cigarettes but quit tobacco use. He tested positive for HIV and has been on multiple drug therapy for several years with stability.  In 2015, he self discontinued 3 of his medications resulting in further increase in blood pressure and at which time lisinopril was added to his medical regimen.  Not having seen him in 3 years, I saw him in October 2022.  At that time blood pressure was elevated and I recommended further titration of  lisinopril from 20 mg to 30 mg.  He continued to be on amlodipine 10 mg.  On this increased regimen his blood pressure has stabilized and is excellent today.  He is on rosuvastatin 5 mg daily.  He forgot to obtain fasting lipid studies prior to today's evaluation.  With his previously documented mild nonobstructive CAD in 2003 aggressive lipid lowering therapy is recommended with target LDL less than 70.  In March 2018 LDL cholesterol was 97.  I have recommended he undergo fasting laboratory with a comprehensive metabolic panel, CBC, TSH, fasting lipid studies, and LP(a).  He will be undergoing bilateral hernia surgery is as well as umbilical hernia surgery at  [facility] in January for 2023.  I reviewed his most recent echo Doppler study which showed normal systolic function.  There was mild aortic sclerosis which accounts for his systolic murmur.  He was noted to have mild aortic root dilatation which will need to be followed periodically.  Graded exercise treadmill test was normal at a 7 met workload and peak heart rate at 93% of predicted maximum.  I have recommended resumption of exercise following his surgery.  His HIV is stable on Biktarvy, followed at [facility].  He continues to be on sertraline for mild depression.  I will see him in 6 months for follow-up evaluation or sooner as needed.    Troy Sine, MD, [facility]  11/28/2021 10:28 AM

## 2021-11-28 NOTE — Patient Instructions (Signed)
Medication Instructions:  The current medical regimen is effective;  continue present plan and medications.  *If you need a refill on your cardiac medications before your next appointment, please call your pharmacy*   Lab Work: Fasting lab work (CBC, CMET, TSH, LIPID, LPa)  If you have labs (blood work) drawn today and your tests are completely normal, you will receive your results only by: [place] (if you have MyChart) OR A paper copy in the mail If you have any lab test that is abnormal or we need to change your treatment, we will call you to review the results.   Follow-Up: At [facility], you and your health needs are our priority.  As part of our continuing mission to provide you with exceptional heart care, we have created designated Provider Care Teams.  These Care Teams include your primary Cardiologist (physician) and Advanced Practice Providers (APPs -  Physician Assistants and Nurse Practitioners) who all work together to provide you with the care you need, when you need it.  We recommend signing up for the patient portal called "MyChart".  Sign up information is provided on this After Visit Summary.  MyChart is used to connect with patients for Virtual Visits (Telemedicine).  Patients are able to view lab/test results, encounter notes, upcoming appointments, etc.  Non-urgent messages can be sent to your provider as well.   To learn more about what you can do with MyChart, go to [email or web address].    Your next appointment:   6 month(s)  The format for your next appointment:   In Person  Provider:   Shelva Majestic, MD

## 2022-04-03 ENCOUNTER — Encounter: Payer: Self-pay | Admitting: Internal Medicine

## 2022-04-03 DIAGNOSIS — Z860100 Personal history of colon polyps, unspecified: Secondary | ICD-10-CM

## 2022-04-03 HISTORY — DX: Personal history of colonic polyps: Z86.010

## 2022-04-03 HISTORY — DX: Personal history of colon polyps, unspecified: Z86.0100

## 2022-04-27 ENCOUNTER — Encounter: Payer: Self-pay | Admitting: Internal Medicine

## 2022-10-26 LAB — CBC
Hematocrit: 46.7 % (ref 37.5–51.0)
Hemoglobin: 15.5 g/dL (ref 13.0–17.7)
MCH: 29.6 pg (ref 26.6–33.0)
MCHC: 33.2 g/dL (ref 31.5–35.7)
MCV: 89 fL (ref 79–97)
Platelets: 283 10*3/uL (ref 150–450)
RBC: 5.23 x10E6/uL (ref 4.14–5.80)
RDW: 11.9 % (ref 11.6–15.4)
WBC: 4.5 10*3/uL (ref 3.4–10.8)

## 2022-10-26 LAB — LIPID PANEL
Chol/HDL Ratio: 2.6 ratio (ref 0.0–5.0)
Cholesterol, Total: 140 mg/dL (ref 100–199)
HDL: 53 mg/dL (ref >39)
LDL Chol Calc (NIH): 73 mg/dL (ref 0–99)
Triglycerides: 67 mg/dL (ref 0–149)
VLDL Cholesterol Cal: 14 mg/dL (ref 5–40)

## 2022-10-26 LAB — COMPREHENSIVE METABOLIC PANEL
ALT: 22 IU/L (ref 0–44)
AST: 24 IU/L (ref 0–40)
Albumin/Globulin Ratio: 1.8 (ref 1.2–2.2)
Albumin: 4.4 g/dL (ref 3.9–4.9)
Alkaline Phosphatase: 74 IU/L (ref 44–121)
BUN/Creatinine Ratio: 20 (ref 10–24)
BUN: 18 mg/dL (ref 8–27)
Bilirubin Total: 0.5 mg/dL (ref 0.0–1.2)
CO2: 24 mmol/L (ref 20–29)
Calcium: 9.1 mg/dL (ref 8.6–10.2)
Chloride: 102 mmol/L (ref 96–106)
Creatinine, Ser: 0.88 mg/dL (ref 0.76–1.27)
Globulin, Total: 2.5 g/dL (ref 1.5–4.5)
Glucose: 101 mg/dL — ABNORMAL HIGH (ref 70–99)
Potassium: 4.5 mmol/L (ref 3.5–5.2)
Sodium: 140 mmol/L (ref 134–144)
Total Protein: 6.9 g/dL (ref 6.0–8.5)
eGFR: 96 mL/min/{1.73_m2} (ref >59)

## 2022-10-26 LAB — LIPOPROTEIN A (LPA): Lipoprotein (a): 137.2 nmol/L — ABNORMAL HIGH (ref <75.0)

## 2022-10-26 LAB — TSH: TSH: 2.87 u[IU]/mL (ref 0.450–4.500)

## 2023-02-02 ENCOUNTER — Telehealth: Payer: Self-pay | Admitting: Internal Medicine

## 2023-02-02 NOTE — Telephone Encounter (Signed)
Patient is calling his PCP is questioning the fact that we had a 5 year recall for him, saying he's not due yet. Pt wanted to make sure that was still Ronald Avery recommendation so he could let his PCP know and get scheduled. Please advise

## 2023-02-03 NOTE — Telephone Encounter (Signed)
Left message for pt to call back  °

## 2023-02-04 NOTE — Telephone Encounter (Signed)
Pt wanting to clarify when his next colonoscopy is due. Recall in epic states 2023 which would have been 5 years:  Chart reviewed and noted: COLONOSCOPY (Pts 45-65yr Insurance coverage will need to be confirmed) (Every 10 Years)  Next due on 02/23/2027  Address Topic     Pt stated that he is not having any GI issues.  Please advise

## 2023-02-04 NOTE — Telephone Encounter (Signed)
I recommend repeating one by 2025 He may schedule now if desired

## 2023-02-05 NOTE — Telephone Encounter (Signed)
Left message for pt to call back  °

## 2023-02-08 NOTE — Telephone Encounter (Signed)
Patient returned your call. Stated if easier you can send him a Mychart message. Please advise.

## 2023-02-08 NOTE — Telephone Encounter (Signed)
Pt was notified of Dr. Carlean Purl recommendations: Pt was scheduled  a colonoscopy with Dr. Carlean Purl on 03/10/2023 at 8:00 AM. Pt made aware. Pt was scheduled for 02/16/2023 at 10:30 AM for a previsit appointment. Pt made aware  Location provided  Pt verbalized understanding with all questions answered.

## 2023-02-08 NOTE — Telephone Encounter (Signed)
Left message for pt to call back  °

## 2023-02-16 ENCOUNTER — Ambulatory Visit (AMBULATORY_SURGERY_CENTER): Payer: Medicare HMO

## 2023-02-16 ENCOUNTER — Encounter: Payer: Self-pay | Admitting: Internal Medicine

## 2023-02-16 VITALS — Ht 72.0 in | Wt 205.0 lb

## 2023-02-16 DIAGNOSIS — Z86010 Personal history of colonic polyps: Secondary | ICD-10-CM

## 2023-02-16 MED ORDER — NA SULFATE-K SULFATE-MG SULF 17.5-3.13-1.6 GM/177ML PO SOLN: 1.0000 | Freq: Once | ORAL | 0 refills | Status: AC

## 2023-02-16 NOTE — Progress Notes (Signed)

## 2023-03-06 ENCOUNTER — Encounter: Payer: Self-pay | Admitting: Internal Medicine

## 2023-03-08 ENCOUNTER — Telehealth: Payer: Self-pay

## 2023-03-08 NOTE — Telephone Encounter (Signed)
OK to proceed w/ colonoscopy as planned based upon current status - if something changes, let us know

## 2023-03-08 NOTE — Telephone Encounter (Signed)
Spoke to pt.  Documented in phone note.

## 2023-03-08 NOTE — Telephone Encounter (Signed)
Pt made aware of Dr. Gessner recommendations: Pt verbalized understanding with all questions answered.   

## 2023-03-08 NOTE — Telephone Encounter (Signed)
Ronald Avery  [facility] Clinical Pool (supporting Gatha Mayer, MD)2 days ago    Hi Dr Carlean Purl, I'm sorry to report that I have a cold with chest congestion today and I'm concerned that my procedure on Wednesday may need to be rescheduled because of that. Please advise.

## 2023-03-08 NOTE — Telephone Encounter (Signed)
Pt was contacted due to my chart that was sent by pt. Pt stated that he has chest and nasal congestion. No Fever, not taking any medication, Was taking cough meds but cough has resolved. No other symptoms. Has Colonoscopy on Wednesday. Pt stated that he is fine to proceed with having colonoscopy on Wednesday or he is fine to reschedule. Whichever the Dr. Alinda Dooms.  Please advise.

## 2023-03-10 ENCOUNTER — Ambulatory Visit: Payer: Medicare HMO | Admitting: Internal Medicine

## 2023-03-10 ENCOUNTER — Encounter: Payer: Self-pay | Admitting: Internal Medicine

## 2023-03-10 VITALS — BP 103/69 | HR 60 | Temp 97.1°F | Resp 14 | Ht 72.0 in | Wt 199.8 lb

## 2023-03-10 DIAGNOSIS — Z86010 Personal history of colonic polyps: Secondary | ICD-10-CM | POA: Diagnosis not present

## 2023-03-10 DIAGNOSIS — D124 Benign neoplasm of descending colon: Secondary | ICD-10-CM

## 2023-03-10 DIAGNOSIS — D125 Benign neoplasm of sigmoid colon: Secondary | ICD-10-CM | POA: Diagnosis not present

## 2023-03-10 DIAGNOSIS — Z09 Encounter for follow-up examination after completed treatment for conditions other than malignant neoplasm: Secondary | ICD-10-CM | POA: Diagnosis present

## 2023-03-10 DIAGNOSIS — K635 Polyp of colon: Secondary | ICD-10-CM | POA: Diagnosis not present

## 2023-03-10 MED ORDER — SODIUM CHLORIDE 0.9 % IV SOLN: 500.0000 mL | INTRAVENOUS | Status: DC

## 2023-03-10 NOTE — Progress Notes (Signed)
Called to room to assist during endoscopic procedure.  Patient ID and intended procedure confirmed with present staff. Received instructions for my participation in the procedure from the performing physician.  

## 2023-03-10 NOTE — Op Note (Signed)
[facility] Patient Name: Ronald Avery Procedure Date: 03/10/2023 8:01 AM MRN: [id number] Endoscopist: Gatha Mayer , MD, [id number] Age: 67 Referring MD:  Date of Birth: June 16, 1956 Gender: Male Account #: [id number] Procedure:                Colonoscopy Indications:              High risk colon cancer surveillance: Personal                            history of sessile serrated colon polyp (less than                            10 mm in size) with no dysplasia, Last colonoscopy:                            2018 Medicines:                Monitored Anesthesia Care Procedure:                Pre-Anesthesia Assessment:                           - Prior to the procedure, a History and Physical                            was performed, and patient medications and                            allergies were reviewed. The patient's tolerance of                            previous anesthesia was also reviewed. The risks                            and benefits of the procedure and the sedation                            options and risks were discussed with the patient.                            All questions were answered, and informed consent                            was obtained. Prior Anticoagulants: The patient has                            taken no anticoagulant or antiplatelet agents. ASA                            Grade Assessment: III - A patient with severe                            systemic disease. After reviewing the risks and  benefits, the patient was deemed in satisfactory                            condition to undergo the procedure.                           After obtaining informed consent, the colonoscope                            was passed under direct vision. Throughout the                            procedure, the patient's blood pressure, pulse, and                            oxygen saturations were monitored continuously. The                             Olympus CF-HQ190L [id number]) Colonoscope was                            introduced through the anus and advanced to the the                            cecum, identified by appendiceal orifice and                            ileocecal valve. The colonoscopy was performed                            without difficulty. The patient tolerated the                            procedure well. The quality of the bowel                            preparation was good. The ileocecal valve,                            appendiceal orifice, and rectum were photographed.                            The bowel preparation used was SUPREP via split                            dose instruction. Scope In: 8:08:00 AM Scope Out: 8:24:29 AM Scope Withdrawal Time: 0 hours 13 minutes 56 seconds  Total Procedure Duration: 0 hours 16 minutes 29 seconds  Findings:                 The perianal and digital rectal examinations were                            normal. Pertinent negatives include normal prostate                            (  size, shape, and consistency).                           Two sessile polyps were found in the sigmoid colon                            and descending colon. The polyps were diminutive in                            size. These polyps were removed with a cold snare.                            Resection and retrieval were complete. Verification                            of patient identification for the specimen was                            done. Estimated blood loss was minimal.                           Multiple diverticula were found in the entire                            colon. There was no evidence of diverticular                            bleeding.                           The exam was otherwise without abnormality on                            direct and retroflexion views. Complications:            No immediate complications. Estimated Blood Loss:      Estimated blood loss was minimal. Impression:               - Two diminutive polyps in the sigmoid colon and in                            the descending colon, removed with a cold snare.                            Resected and retrieved.                           - Severe diverticulosis in the entire examined                            colon. There was no evidence of diverticular                            bleeding.                           -  The examination was otherwise normal on direct                            and retroflexion views.                           - Personal history of colonic polyp - serrated                            lesion cecum 2018. Recommendation:           - Patient has a contact number available for                            emergencies. The signs and symptoms of potential                            delayed complications were discussed with the                            patient. Return to normal activities tomorrow.                            Written discharge instructions were provided to the                            patient.                           - Resume previous diet.                           - Continue present medications.                           - Await pathology results.                           - Repeat colonoscopy is recommended for                            surveillance. The colonoscopy date will be                            determined after pathology results from today's                            exam become available for review. Gatha Mayer, MD 03/10/2023 8:32:31 AM This report has been signed electronically.

## 2023-03-10 NOTE — Progress Notes (Signed)
A and O x3. Report to RN. Tolerated MAC anesthesia well. 

## 2023-03-10 NOTE — Progress Notes (Signed)
[facility] History and Physical   Primary Care Physician:  Durward Parcel, MD   Reason for Procedure:   Hx colon polyps  Plan:    colonoscopy     HPI: Ronald Avery is a 67 y.o. male s/p removal serrated polyp (HPP) cecum 2018 and hx AIN   Past Medical History:  Diagnosis Date   AIN (anal intraepithelial neoplasia) anal canal    Anxiety    Aortic valve sclerosis 08/2012   Arthritis    CAD (coronary artery disease) 2001   minor- 30-40% RCA   Hepatitis C    History of smoking    HIV (human immunodeficiency virus infection)    HTN (hypertension)    Hx of colonic polyp 04/03/2022   serrated lesion (hyperplastic) cecum 2018   Hydrocele    Psoriasis     Past Surgical History:  Procedure Laterality Date   CARDIAC CATHETERIZATION     COLONOSCOPY  2018   KNEE ARTHROSCOPY Right 1973   TESTICULAR EXPLORATION Left 1882   cyst    Prior to Admission medications   Medication Sig Start Date End Date Taking? Authorizing Provider  amLODipine (NORVASC) 10 MG tablet TAKE 1 TABLET BY MOUTH  DAILY 09/18/16  Yes Troy Sine, MD  aspirin 81 MG tablet Take 81 mg by mouth daily.   Yes [provider]  benzonatate (TESSALON) 200 MG capsule Take by mouth 2 (two) times daily as needed. 02/23/23  Yes [provider]  bictegravir-emtricitabine-tenofovir AF (BIKTARVY) 50-200-25 MG TABS tablet Take 1 tablet by mouth daily. 12/19/18  Yes [provider]  ibuprofen (ADVIL) 800 MG tablet Take by mouth every 6 (six) hours as needed. 02/23/23  Yes [provider]  lisinopril (ZESTRIL) 30 MG tablet Take 1 tablet (30 mg total) by mouth daily. 09/29/21  Yes Troy Sine, MD  promethazine-dextromethorphan (PROMETHAZINE-DM) 6.25-15 MG/5ML syrup Take 5 mLs by mouth 3 (three) times daily as needed. 02/23/23  Yes [provider]  rosuvastatin (CRESTOR) 5 MG tablet Take 5 mg by mouth daily.   Yes [provider]  urea (CARMOL) 40 % CREA 2  (two) times daily. 01/27/23  Yes [provider]  sertraline (ZOLOFT) 50 MG tablet Take 1 tablet by mouth daily. 09/04/21 09/04/22  [provider]  VIAGRA 100 MG tablet Take 100 mg by mouth daily as needed. 03/26/17   [provider]    Current Outpatient Medications  Medication Sig Dispense Refill   amLODipine (NORVASC) 10 MG tablet TAKE 1 TABLET BY MOUTH  DAILY 90 tablet 3   aspirin 81 MG tablet Take 81 mg by mouth daily.     benzonatate (TESSALON) 200 MG capsule Take by mouth 2 (two) times daily as needed.     bictegravir-emtricitabine-tenofovir AF (BIKTARVY) 50-200-25 MG TABS tablet Take 1 tablet by mouth daily.     ibuprofen (ADVIL) 800 MG tablet Take by mouth every 6 (six) hours as needed.     lisinopril (ZESTRIL) 30 MG tablet Take 1 tablet (30 mg total) by mouth daily. 90 tablet 3   promethazine-dextromethorphan (PROMETHAZINE-DM) 6.25-15 MG/5ML syrup Take 5 mLs by mouth 3 (three) times daily as needed.     rosuvastatin (CRESTOR) 5 MG tablet Take 5 mg by mouth daily.     urea (CARMOL) 40 % CREA 2 (two) times daily.     sertraline (ZOLOFT) 50 MG tablet Take 1 tablet by mouth daily.     VIAGRA 100 MG tablet Take 100 mg by mouth  daily as needed.  0   Current Facility-Administered Medications  Medication Dose Route Frequency Provider Last Rate Last Admin   0.9 %  sodium chloride infusion  500 mL Intravenous Continuous Gatha Mayer, MD        Allergies as of 03/10/2023   (No Known Allergies)    Family History  Problem Relation Age of Onset   Cancer Mother 24       type unknown   Heart disease Mother    Cancer Father        blood, type unknown   Colon cancer Neg Hx    Colon polyps Neg Hx    Esophageal cancer Neg Hx    Rectal cancer Neg Hx    Stomach cancer Neg Hx     Social History   Socioeconomic History   Marital status: Divorced    Spouse name: Not on file   Number of children: 2   Years of education: Not on file   Highest education  level: Not on file  Occupational History   Occupation: [employer]  Tobacco Use   Smoking status: Former    Types: Cigarettes    Quit date: 12/23/1988    Years since quitting: 34.2   Smokeless tobacco: Never  Vaping Use   Vaping Use: Never used  Substance and Sexual Activity   Alcohol use: Yes    Comment: occasional   Drug use: Never   Sexual activity: Not on file  Other Topics Concern   Not on file  Social History Narrative   Retired from [employer]   Works part-time at [employer], 1 son 1 daughter   Male partners after divorce   EtOH is rarae max 1/week   No tobacco (former smoker)   No drugs   Social Determinants of Radio broadcast assistant Strain: Not on file  Food Insecurity: Not on file  Transportation Needs: Not on file  Physical Activity: Not on file  Stress: Not on file  Social Connections: Not on file  Intimate Partner Violence: Not on file    Review of Systems:  All other review of systems negative except as mentioned in the HPI.  Physical Exam: Vital signs BP (!) 106/57   Pulse 67   Temp (!) 97.1 F (36.2 C) (Skin)   Ht 6' (1.829 m)   Wt 199 lb 12.8 oz (90.6 kg)   SpO2 96%   BMI 27.10 kg/m   General:   Alert,  Well-developed, well-nourished, pleasant and cooperative in NAD Lungs:  Clear throughout to auscultation.   Heart:  Regular rate and rhythm; no murmurs, clicks, rubs,  or gallops. Abdomen:  Soft, nontender and nondistended. Normal bowel sounds.   Neuro/Psych:  Alert and cooperative. Normal mood and affect. A and O x 3   @[name]  Simonne Maffucci, MD, [facility] [phone] (pager) 03/10/2023 7:57 AM@

## 2023-03-10 NOTE — Patient Instructions (Addendum)
Two tiny polyps found and removed.  I will let you know pathology results and when to have another routine colonoscopy by mail and/or My Chart.  You also have a condition called diverticulosis - common and not usually a problem. Please read the handout provided.  I appreciate the opportunity to care for you. Gatha Mayer, MD, [facility]   Discharge instructions given. Handouts on polyps and Diverticulosis. Resume previous medications. YOU HAD AN ENDOSCOPIC PROCEDURE TODAY AT [facility]:   Refer to the procedure report that was given to you for any specific questions about what was found during the examination.  If the procedure report does not answer your questions, please call your gastroenterologist to clarify.  If you requested that your care partner not be given the details of your procedure findings, then the procedure report has been included in a sealed envelope for you to review at your convenience later.  YOU SHOULD EXPECT: Some feelings of bloating in the abdomen. Passage of more gas than usual.  Walking can help get rid of the air that was put into your GI tract during the procedure and reduce the bloating. If you had a lower endoscopy (such as a colonoscopy or flexible sigmoidoscopy) you may notice spotting of blood in your stool or on the toilet paper. If you underwent a bowel prep for your procedure, you may not have a normal bowel movement for a few days.  Please Note:  You might notice some irritation and congestion in your nose or some drainage.  This is from the oxygen used during your procedure.  There is no need for concern and it should clear up in a day or so.  SYMPTOMS TO REPORT IMMEDIATELY:  Following lower endoscopy (colonoscopy or flexible sigmoidoscopy):  Excessive amounts of blood in the stool  Significant tenderness or worsening of abdominal pains  Swelling of the abdomen that is new, acute  Fever of 100F or higher   For urgent or emergent  issues, a gastroenterologist can be reached at any hour by calling [phone]. Do not use MyChart messaging for urgent concerns.    DIET:  We do recommend a small meal at first, but then you may proceed to your regular diet.  Drink plenty of fluids but you should avoid alcoholic beverages for 24 hours.  ACTIVITY:  You should plan to take it easy for the rest of today and you should NOT DRIVE or use heavy machinery until tomorrow (because of the sedation medicines used during the test).    FOLLOW UP: Our staff will call the number listed on your records the next business day following your procedure.  We will call around 7:15- 8:00 am to check on you and address any questions or concerns that you may have regarding the information given to you following your procedure. If we do not reach you, we will leave a message.     If any biopsies were taken you will be contacted by phone or by letter within the next 1-3 weeks.  Please call us at [phone] if you have not heard about the biopsies in 3 weeks.    SIGNATURES/CONFIDENTIALITY: You and/or your care partner have signed paperwork which will be entered into your electronic medical record.  These signatures attest to the fact that that the information above on your After Visit Summary has been reviewed and is understood.  Full responsibility of the confidentiality of this discharge information lies with you and/or your care-partner.

## 2023-03-10 NOTE — Progress Notes (Signed)
Patient states no medical or surgical changes since previsit or office visit.

## 2023-03-11 ENCOUNTER — Telehealth: Payer: Self-pay

## 2023-03-11 NOTE — Telephone Encounter (Signed)
No answer, left message to call if having any issues or concerns, B.Yates Weisgerber RN 

## 2023-03-22 ENCOUNTER — Encounter: Payer: Self-pay | Admitting: Internal Medicine

## 2023-09-15 ENCOUNTER — Encounter: Payer: Self-pay | Admitting: Cardiovascular Disease

## 2023-09-15 ENCOUNTER — Ambulatory Visit: Payer: Medicare HMO | Attending: Cardiovascular Disease | Admitting: Cardiovascular Disease

## 2023-09-15 VITALS — BP 124/86 | HR 62 | Ht 72.0 in | Wt 209.2 lb

## 2023-09-15 DIAGNOSIS — I2510 Atherosclerotic heart disease of native coronary artery without angina pectoris: Secondary | ICD-10-CM | POA: Diagnosis not present

## 2023-09-15 DIAGNOSIS — I10 Essential (primary) hypertension: Secondary | ICD-10-CM

## 2023-09-15 DIAGNOSIS — I77810 Thoracic aortic ectasia: Secondary | ICD-10-CM

## 2023-09-15 DIAGNOSIS — E785 Hyperlipidemia, unspecified: Secondary | ICD-10-CM

## 2023-09-15 DIAGNOSIS — I358 Other nonrheumatic aortic valve disorders: Secondary | ICD-10-CM | POA: Diagnosis not present

## 2023-09-15 DIAGNOSIS — B20 Human immunodeficiency virus [HIV] disease: Secondary | ICD-10-CM

## 2023-09-15 NOTE — Patient Instructions (Signed)
Medication Instructions:  No changes *If you need a refill on your cardiac medications before your next appointment, please call your pharmacy*   Lab Work: No labs ordered If you have labs (blood work) drawn today and your tests are completely normal, you will receive your results only by: MyChart Message (if you have MyChart) OR A paper copy in the mail If you have any lab test that is abnormal or we need to change your treatment, we will call you to review the results.   Testing/Procedures: Your physician has requested that you have an echocardiogram in October 2025. Echocardiography is a painless test that uses sound waves to create images of your heart. It provides your doctor with information about the size and shape of your heart and how well your heart's chambers and valves are working. This procedure takes approximately one hour. There are no restrictions for this procedure. Please do NOT wear cologne, perfume, aftershave, or lotions (deodorant is allowed).  Please arrive 15 minutes prior to your appointment time.    Follow-Up: At [facility], you and your health needs are our priority.  As part of our continuing mission to provide you with exceptional heart care, we have created designated Provider Care Teams.  These Care Teams include your primary Cardiologist (physician) and Advanced Practice Providers (APPs -  Physician Assistants and Nurse Practitioners) who all work together to provide you with the care you need, when you need it.  We recommend signing up for the patient portal called "MyChart".  Sign up information is provided on this After Visit Summary.  MyChart is used to connect with patients for Virtual Visits (Telemedicine).  Patients are able to view lab/test results, encounter notes, upcoming appointments, etc.  Non-urgent messages can be sent to your provider as well.   To learn more about what you can do with MyChart, go to [email or web address].    Your  next appointment:   1 year(s)  Provider:   Epifanio Lesches, MD after the Echo Procedure

## 2023-09-15 NOTE — Progress Notes (Signed)
Patient ID: Ronald Avery, male   DOB: December 04, 1956, 67 y.o.   MRN: [id number]        HPI: Ronald Avery is a 67 y.o. male who presents for a 22 month follow-up cardiology evaluation.    [name] has a history of hypertension. I first saw him in 2001 when he presented with stage II hypertension. He has been on combination therapy since that time. Because of recurrent episodes of chest tightness worrisome for angina I  performed cardiac catheterization on 08/23/2000 which revealed 30-40% mid RCA narrowing and 20% ostial stenosis involving the PDA branch of the right artery artery. He has been treated medically. His last nuclear perfusion study was in August 2008 which continued to show normal perfusion.  He has been on combination therapy for hypertension. He has had mildly elevated cholesterol levels in the in the past but these have significantly improved with aggressive diet and exercise.  When I saw him in 2014, he was on amlodipine 5 mg, metoprolol XL 25 mg, Ramipril 10 mg, and hydrochlorothiazide 12.5 mg daily for blood pressure control.  His blood pressure was stable.  He tested positive for HIV and when l saw him in 2015 he was on triple drug therapy and is followed in infectious disease clinic at [facility].  In 2015 he was more fatigued  He was more fatigued and he decided to stop 3 of his blood pressure medications and, consequently, he was only been taking amlodipine 5 mg along with his aspirin and Atripla.  He stopped hydrochlorothiazide, Toprol-XL, and Ramipril.  When I saw him in the office, he had stage II hypertension despite taking amlodipine 5 mg and I titrated this to 10 mg and started him back on lisinopril.  I saw him in October 2018 and over the previous 3 years he completed a master's degree in social work and graduated in May 2017.  However, he currently is working 3 days per week at the [employer].  He  remained active and exercises fairly regularly but is not  been running as much as he had in the past.  He has continued to be on amlodipine 10 mg, and lisinopril 10 mg for hypertension.  He was on descovy in addition to dolutegravir.  He is followed closely at [facility] infectious disease and has negative HIV virus.  He denies any chest pain.  He denies any palpitations.  Laboratory was done at [facility] on 07/07/2017.  Total cholesterol was 172, triglycerides 153, HDL 74, and LDL 94.  Serum creatinine is 1.03 with a glucose of 83.  LFTs were normal.   After not having seen him since 2018, I saw him September 29, 2021 for follow-up evaluation.  Since his prior evaluation he had some issues with depression and is on antidepressant medication.  He also admitted to weight gain during the COVID pandemic and was not going to the gym to exercise.  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  Recently he has noticed blood pressure elevation and had been evaluated in urgent care where his initial blood pressure at home was 170 and upon arrival was 150.  He would like to start exercising.  However he admits that he is out of shape.  He has not had recent laboratory.  He has been on a medical regimen consisting of amlodipine 10 mg, lisinopril 20 mg which had recently been increased from 10 mg, and had previously been on clonidine  which she is no longer taking.  He is on sertraline 50 mg for depression.  He also takes amitriptyline 10 mg at bedtime.  He continues to be on Biktarvy for his HIV which is controlled.  In that evaluation, with his blood pressure elevation I recommended titration of lisinopril to 30 mg with close blood pressure monitoring.  I also recommended he undergo fasting laboratory.  Since 2019, his weight had increased from 190 pounds when he was exercising regularly to 220 pounds.  In order to provide him some reassurance prior to resuming more intense exercise I recommended a routine graded exercise treadmill study and echocardiography.  An echo  Doppler study in October 09, 2021 showed normal LV function with EF 60 to 65%.  There was mild aortic sclerosis without stenosis.  There was mild dilation of his aortic root at 42 mm and his ascending aorta measured 37 mm.  Graded exercise treadmill study was negative for ischemia and he exercised to a 7 met workload and achieved a peak heart rate of 144, representing 93% of predicted maximum.  There was no chest pain or ECG changes.  I last saw him on November 28, 2021 at which time he felt well and denied any exertional precipitated chest pain.  At times he has noted some dyspeptic type symptoms.  He is scheduled to undergo elective hernia surgery at [facility] on January 4.  He never went to the laboratory in the fasting state and as result fasting labs have not been done.  He denies palpitations.  He states his blood pressure has improved with his increase lisinopril.    Since I last saw him, he is now fully retired.  He feels much better and has significantly less stress.  He is doing some Scientist, forensic.  He exercises and walks fairly regularly.  He continues to be on amlodipine 10 mg, lisinopril 30 mg, and rosuvastatin 5 mg.  His HIV is well-controlled on Biktarvy.  He denies any chest pain, palpitations, presyncope or syncope.  He gets laboratory checked at [facility] fairly regularly.  Most recent laboratory from April 2024 showed a potassium of 3.9, creatinine 0.79 and BUN 14.  Lipid studies were excellent with total cholesterol 126, triglycerides 118, HDL 47, and LDL 55.  He has been having significant difficulty getting appointment to see me and he presents for a 17-month follow-up evaluation.   Past Medical History:  Diagnosis Date   AIN (anal intraepithelial neoplasia) anal canal    Anxiety    Aortic valve sclerosis 08/2012   Arthritis    CAD (coronary artery disease) 2001   minor- 30-40% RCA   Hepatitis C    History of smoking    HIV (human immunodeficiency virus infection) (HCC)    HTN  (hypertension)    Hx of colonic polyp 04/03/2022   serrated lesion (hyperplastic) cecum 2018   Hydrocele    Psoriasis     Past Surgical History:  Procedure Laterality Date   CARDIAC CATHETERIZATION     COLONOSCOPY  2018   KNEE ARTHROSCOPY Right 1973   TESTICULAR EXPLORATION Left 1882   cyst    No Known Allergies  Current Outpatient Medications  Medication Sig Dispense Refill   amLODipine (NORVASC) 10 MG tablet TAKE 1 TABLET BY MOUTH  DAILY 90 tablet 3   aspirin 81 MG tablet Take 81 mg by mouth daily.     bictegravir-emtricitabine-tenofovir AF (BIKTARVY) 50-200-25 MG TABS tablet Take 1 tablet by mouth daily.  lisinopril (ZESTRIL) 30 MG tablet Take 1 tablet (30 mg total) by mouth daily. 90 tablet 3   rosuvastatin (CRESTOR) 5 MG tablet Take 5 mg by mouth daily.     urea (CARMOL) 40 % CREA 2 (two) times daily.     VIAGRA 100 MG tablet Take 100 mg by mouth daily as needed.  0   benzonatate (TESSALON) 200 MG capsule Take by mouth 2 (two) times daily as needed. (Patient not taking: Reported on 09/15/2023)     ibuprofen (ADVIL) 800 MG tablet Take by mouth every 6 (six) hours as needed. (Patient not taking: Reported on 09/15/2023)     promethazine-dextromethorphan (PROMETHAZINE-DM) 6.25-15 MG/5ML syrup Take 5 mLs by mouth 3 (three) times daily as needed. (Patient not taking: Reported on 09/15/2023)     sertraline (ZOLOFT) 50 MG tablet Take 1 tablet by mouth daily.     No current facility-administered medications for this visit.    Social History   Socioeconomic History   Marital status: Divorced    Spouse name: Not on file   Number of children: 2   Years of education: Not on file   Highest education level: Not on file  Occupational History   Occupation: [employer]  Tobacco Use   Smoking status: Former    Current packs/day: 0.00    Types: Cigarettes    Quit date: 12/23/1988    Years since quitting: 34.7   Smokeless tobacco: Never  Vaping Use   Vaping status: Never Used   Substance and Sexual Activity   Alcohol use: Yes    Comment: occasional   Drug use: Never   Sexual activity: Not on file  Other Topics Concern   Not on file  Social History Narrative   Retired from [employer]   Works part-time at [employer], 1 son 1 daughter   Male partners after divorce   EtOH is rarae max 1/week   No tobacco (former smoker)   No drugs   Social Determinants of Health   Financial Resource Strain: Low Risk  (01/27/2023)   Received from [facility], [facility]   Overall Financial Resource Strain (CARDIA)    Difficulty of Paying Living Expenses: Not hard at all  Food Insecurity: No Food Insecurity (01/27/2023)   Received from [facility], [facility]   Hunger Vital Sign    Worried About Running Out of Food in the Last Year: Never true    Ran Out of Food in the Last Year: Never true  Transportation Needs: No Transportation Needs (01/27/2023)   Received from [facility], [facility]   PRAPARE - Transportation    Lack of Transportation (Medical): No    Lack of Transportation (Non-Medical): No  Physical Activity: Not on file  Stress: Not on file  Social Connections: Unknown (02/23/2023)   Received from [facility], [facility]   Social Network    Social Network: Not on file  Intimate Partner Violence: Unknown (02/23/2023)   Received from [facility], [facility]   HITS    Physically Hurt: Not on file    Insult or Talk Down To: Not on file    Threaten Physical Harm: Not on file    Scream or Curse: Not on file    Family History  Problem Relation Age of Onset   Cancer Mother 58       type unknown   Heart disease Mother    Cancer Father  blood, type unknown   Colon cancer Neg Hx    Colon polyps Neg Hx    Esophageal cancer Neg Hx    Rectal cancer Neg Hx    Stomach cancer Neg Hx    Socially he is divorced and has 2 children. Remote tobacco history but he quit many years ago. Does drink occasional  alcohol. He does exercise.   ROS General: Negative; No fevers, chills, or night sweats; positive for fatigue HEENT: Negative; No changes in vision or hearing, sinus congestion, difficulty swallowing Pulmonary: Negative; No cough, wheezing, shortness of breath, hemoptysis Cardiovascular: Negative; No chest pain, presyncope, syncope, palpitations GI: Negative; No nausea, vomiting, diarrhea, or abdominal pain GU: Negative; No dysuria, hematuria, or difficulty voiding Musculoskeletal: Negative; no myalgias, joint pain, or weakness Hematologic/Oncology: Positive for HIV; no easy bruising, bleeding Endocrine: Negative; no heat/cold intolerance; no diabetes Neuro: Negative; no changes in balance, headaches Skin: Negative; No rashes or skin lesions Psychiatric: Positive for depression Sleep: Negative; No snoring, daytime sleepiness, hypersomnolence, bruxism, restless legs, hypnogognic hallucinations, no cataplexy Other comprehensive 14 point system review is negative.  PE BP 124/86   Pulse 62   Ht 6' (1.829 m)   Wt 209 lb 3.2 oz (94.9 kg)   SpO2 92%   BMI 28.37 kg/m    Repeat blood pressure by me was 126/80.  Wt Readings from Last 3 Encounters:  09/15/23 209 lb 3.2 oz (94.9 kg)  03/10/23 199 lb 12.8 oz (90.6 kg)  02/16/23 205 lb (93 kg)   General: Alert, oriented, no distress.  Skin: normal turgor, no rashes, warm and dry HEENT: Normocephalic, atraumatic. Pupils equal round and reactive to light; sclera anicteric; extraocular muscles intact;  Nose without nasal septal hypertrophy Mouth/Parynx benign; Mallinpatti scale 2 Neck: No JVD, no carotid bruits; normal carotid upstroke Lungs: clear to ausculatation and percussion; no wheezing or rales Chest wall: without tenderness to palpitation Heart: PMI not displaced, RRR, s1 s2 normal, 1/6 systolic murmur, no diastolic murmur, no rubs, gallops, thrills, or heaves Abdomen: soft, nontender; no hepatosplenomehaly, BS+; abdominal aorta  nontender and not dilated by palpation. Back: no CVA tenderness Pulses 2+ Musculoskeletal: full range of motion, normal strength, no joint deformities Extremities: no clubbing cyanosis or edema, Homan's sign negative  Neurologic: grossly nonfocal; Cranial nerves grossly wnl Psychologic: Normal mood and affect    EKG Interpretation Date/Time:  Wednesday September 15 2023 14:59:44 EDT Ventricular Rate:  62 PR Interval:  216 QRS Duration:  74 QT Interval:  404 QTC Calculation: 410 R Axis:   48  Text Interpretation: Sinus rhythm with 1st degree A-V block When compared with ECG of 06-Aug-2021 21:17, Criteria for Anterior infarct are no longer Present Confirmed by Nicki Guadalajara ([id number]) on 09/15/2023 3:31:57 PM    November 28, 2021 ECG (independently read by me): Normal sinus rhythm at 64 bpm, isolated PAC, first-degree AV block with a PR interval at 2 1 8  ms.  September 29, 2021 ECG (independently read by me): NSR with PAC at 68; Borderline 1st degree AV block; PR 204 msec  October 2018 ECG (independently read by me): Sinus bradycardia at 59 bpm.  First degree AV block; PR interval 222 ms.  No ST segment changes.  October 2017 ECG (independently read by me): Normal sinus rhythm at 65 bpm.  First degree AV block with a PR interval at 216 ms.  October 2015 ECG (independently read by me): Normal sinus rhythm at 73 beats per minute.  Small Q-wave in lead 3.  No  significant ST-T changes.  September 2014 ECG: Normal sinus rhythm at 61 beats per minute. Very mild first-degree AV block with PR 208 ms. Normal QTc interval.  LABS:    Latest Ref Rng & Units 03/30/2019    8:09 AM 04/10/2017    4:21 PM 10/08/2014    8:13 AM  BMP  Glucose 70 - 99 mg/dL 409  92  811   BUN 8 - 27 mg/dL 18  15  18    Creatinine 0.76 - 1.27 mg/dL 9.14  7.82  9.56   BUN/Creat Ratio 10 - 24 20     Sodium 134 - 144 mmol/L 140  137  139   Potassium 3.5 - 5.2 mmol/L 4.5  3.4  4.5   Chloride 96 - 106 mmol/L 102  103  104    CO2 20 - 29 mmol/L 24  26  25    Calcium 8.6 - 10.2 mg/dL 9.1  8.9  8.7       Latest Ref Rng & Units 03/30/2019    8:09 AM 12/23/2018    9:49 AM 04/10/2017    4:21 PM  Hepatic Function  Total Protein 6.0 - 8.5 g/dL 6.9  7.7  7.5   Albumin 3.9 - 4.9 g/dL 4.4  4.6  4.1   AST 0 - 40 IU/L 24  31  26    ALT 0 - 44 IU/L 22  40  30   Alk Phosphatase 44 - 121 IU/L 74  59  70   Total Bilirubin 0.0 - 1.2 mg/dL 0.5  0.6  0.9   Bilirubin, Direct 0.0 - 0.3 mg/dL  0.1        Latest Ref Rng & Units 03/30/2019    8:09 AM 04/10/2017    4:21 PM 10/08/2014    8:13 AM  CBC  WBC 3.4 - 10.8 x10E3/uL 4.5  11.1  4.7   Hemoglobin 13.0 - 17.7 g/dL 21.3  08.6  57.8   Hematocrit 37.5 - 51.0 % 46.7  46.1  42.5   Platelets 150 - 450 x10E3/uL 283  264  244    Lab Results  Component Value Date   MCV 89 03/30/2019   MCV 87.5 04/10/2017   MCV 87.6 10/08/2014   Lab Results  Component Value Date   TSH 2.870 03/30/2019   Lipid Panel     Component Value Date/Time   CHOL 140 03/30/2019 0809   TRIG 67 03/30/2019 0809   HDL 53 03/30/2019 0809   CHOLHDL 2.6 03/30/2019 0809   CHOLHDL 3.0 10/08/2014 0813   VLDL 14 10/08/2014 0813   LDLCALC 73 03/30/2019 0809    IMPRESSION: 1. Primary hypertension   2. Coronary artery disease involving native coronary artery of native heart without angina pectoris   3. Hyperlipidemia with target LDL less than 70   4. Aortic valve sclerosis   5. Aortic root dilatation (HCC)   6. Human immunodeficiency virus (HIV) disease (HCC)     ASSESSMENT AND PLAN: Mr. Lile is a 67 year old male who has a long-standing history of hypertension and when I first saw him in 2001 he presented with stage II hypertension.  He was treated initially and stabilized with  a 4 drug regimen, consisting of amlodipine, beta blocker therapy, ACE inhibition, and low-dose diuretic therapy.  A cardiac catheterization for chest pain in 2003, showed mild, nonobstructive CAD.  He remotely had smoked  cigarettes but quit tobacco use. He tested positive for HIV and has been on multiple drug therapy for several  years with stability.  In 2015, he self discontinued 3 of his medications resulting in further increase in blood pressure and at which time lisinopril was added to his medical regimen.  When seen by me 3 years later in October 2022 his blood pressure was elevated and I recommended further titration of lisinopril from 20 mg to 30 mg.  He continued to be on amlodipine 10 mg.  On this increased regimen his blood pressure has stabilized and is excellent today.  He is on rosuvastatin 5 mg daily.  He forgot to obtain fasting lipid studies prior to today's evaluation.  With his previously documented mild nonobstructive CAD in 2003 aggressive lipid lowering therapy was recommended with target LDL less than 70.  In March 2018 LDL cholesterol was 97.  I have recommended he undergo fasting laboratory with a comprehensive metabolic panel, CBC, TSH, fasting lipid studies, and LP(a).  Since my last evaluation in 2022, he tolerated hernia surgery well.  Presently, his blood pressure is well-controlled on current therapy.  He continues to be on amlodipine 10 mg, lisinopril 30 mg for blood pressure control.  He is HIV is stable followed closely at [facility] and he continues to be on [place].  His most recent lipid panel done in [place] in April 2024 showed an LDL cholesterol at 55 on current therapy.  Clinically he is cardiovascularly stable.  In the future it would be worthwhile for him to undergo subsequent echo evaluation for reassessment of minimal aortic root dilatation at 42 mm on his November 2022 echo.  I have recommended in 1 year he undergo a follow-up echo Doppler assessment and since most likely I will be retiring I have suggested he see Dr. Epifanio Lesches for follow-up Cardiologic care after my retirement.   Lennette Bihari, MD, [facility]  09/17/2023 10:03 AM

## 2023-09-17 ENCOUNTER — Encounter: Payer: Self-pay | Admitting: Cardiovascular Disease

## 2024-09-14 ENCOUNTER — Ambulatory Visit (HOSPITAL_COMMUNITY)
Admission: RE | Admit: 2024-09-14 | Discharge: 2024-09-14 | Disposition: A | Discharge status: Home / Self Care | Payer: Medicare HMO | Source: Ambulatory Visit | Attending: Cardiology | Admitting: Cardiology

## 2024-09-14 DIAGNOSIS — I77810 Thoracic aortic ectasia: Secondary | ICD-10-CM | POA: Diagnosis present

## 2024-09-14 DIAGNOSIS — I10 Essential (primary) hypertension: Secondary | ICD-10-CM | POA: Diagnosis not present

## 2024-09-14 DIAGNOSIS — I340 Nonrheumatic mitral (valve) insufficiency: Secondary | ICD-10-CM | POA: Insufficient documentation

## 2024-09-14 LAB — ECHOCARDIOGRAM COMPLETE
Area-P 1/2: 3.12 cm2
S' Lateral: 2.7 cm

## 2024-09-15 ENCOUNTER — Ambulatory Visit: Payer: Self-pay | Admitting: Cardiology

## 2024-09-27 NOTE — Progress Notes (Unsigned)
 Cardiology Office Note:    Date:  09/29/2024   ID:  DURRELL BARAJAS, DOB [birth year], MRN [id number]  PCP:  Myrna Kay, MD  Cardiologist:  None  Electrophysiologist:  None   Referring MD: Myrna Kay, MD   Chief Complaint  Patient presents with   Coronary Artery Disease    History of Present Illness:    KAHLIL COWANS is a 68 y.o. male with a hx of CAD, hypertension who presents for follow-up.  Previously followed with Dr. Burnard.  Cardiac catheterization in 2001 showed 30 to 40% mid RCA stenosis and 20% ostial PDA stenosis.  Normal perfusion on nuclear stress test in 2008.  ETT 10/2021 showed no evidence of ischemia.  Echocardiogram 09/2024 showed normal biventricular function, no significant valvular disease.  Since last clinic visit, he reports he is doing well.  Denies any chest pain, dyspnea, lightheadedness, syncope, lower extremity edema.  Reports occasional palpitations, just lasting for seconds.  He walks about an hour per day with his dog.    Past Medical History:  Diagnosis Date   AIN (anal intraepithelial neoplasia) anal canal    Anxiety    Aortic valve sclerosis 08/2012   Arthritis    CAD (coronary artery disease) 2001   minor- 30-40% RCA   Hepatitis C    History of smoking    HIV (human immunodeficiency virus infection) (HCC)    HTN (hypertension)    Hx of colonic polyp 04/03/2022   serrated lesion (hyperplastic) cecum 2018   Hydrocele    Psoriasis     Past Surgical History:  Procedure Laterality Date   CARDIAC CATHETERIZATION     COLONOSCOPY  2018   KNEE ARTHROSCOPY Right 1973   TESTICULAR EXPLORATION Left 1882   cyst    Current Medications: Current Meds  Medication Sig   amLODipine  (NORVASC ) 10 MG tablet TAKE 1 TABLET BY MOUTH  DAILY   aspirin 81 MG tablet Take 81 mg by mouth daily.   bictegravir-emtricitabine-tenofovir AF (BIKTARVY) 50-200-25 MG TABS tablet Take 1 tablet by mouth daily.   lisinopril  (ZESTRIL ) 30 MG tablet Take 1 tablet  (30 mg total) by mouth daily.   rosuvastatin (CRESTOR) 5 MG tablet Take 5 mg by mouth daily.   sertraline (ZOLOFT) 50 MG tablet Take 1 tablet by mouth daily.   urea (CARMOL) 40 % CREA 2 (two) times daily.   VIAGRA 100 MG tablet Take 100 mg by mouth daily as needed.     Allergies:   Patient has no known allergies.   Social History   Socioeconomic History   Marital status: Divorced    Spouse name: Not on file   Number of children: 2   Years of education: Not on file   Highest education level: Not on file  Occupational History   Occupation: [employer]  Tobacco Use   Smoking status: Former    Current packs/day: 0.00    Types: Cigarettes    Quit date: 12/23/1988    Years since quitting: 35.7   Smokeless tobacco: Never  Vaping Use   Vaping status: Never Used  Substance and Sexual Activity   Alcohol use: Yes    Comment: occasional   Drug use: Never   Sexual activity: Not on file  Other Topics Concern   Not on file  Social History Narrative   Retired from [employer]   Works part-time at [employer], 1 son 1 daughter   Male partners after divorce   EtOH is rarae  max 1/week   No tobacco (former smoker)   No drugs   Social Drivers of Corporate investment banker Strain: Low Risk  (05/29/2024)   Received from [facility]   Overall Financial Resource Strain (CARDIA)    How hard is it for you to pay for the very basics like food, housing, medical care, and heating?: Not very hard  Food Insecurity: No Food Insecurity (05/29/2024)   Received from [facility]   Hunger Vital Sign    Within the past 12 months, you worried that your food would run out before you got the money to buy more.: Never true    Within the past 12 months, the food you bought just didn't last and you didn't have money to get more.: Never true  Transportation Needs: No Transportation Needs (05/29/2024)   Received from [facility]   PRAPARE - Transportation    Lack of Transportation  (Medical): No    Lack of Transportation (Non-Medical): No  Physical Activity: Not on file  Stress: Not on file  Social Connections: Unknown (02/23/2023)   Received from [facility]   Social Network    Social Network: Not on file     Family History: The patient's family history includes Cancer in his father; Cancer (age of onset: 53) in his mother; Heart disease in his mother. There is no history of Colon cancer, Colon polyps, Esophageal cancer, Rectal cancer, or Stomach cancer.  ROS:   Please see the history of present illness.     All other systems reviewed and are negative.  EKGs/Labs/Other Studies Reviewed:    The following studies were reviewed today:   EKG:   09/28/2024: Sinus rhythm with first-degree AV block with PACs and PVCs, rate 74  Recent Labs: 09/28/2024: BUN 18; Creatinine, Ser 0.88; Potassium 4.7; Sodium 138  Recent Lipid Panel    Component Value Date/Time   CHOL 121 09/28/2024 1131   TRIG 119 09/28/2024 1131   HDL 45 09/28/2024 1131   CHOLHDL 2.7 09/28/2024 1131   CHOLHDL 3.0 10/08/2014 0813   VLDL 14 10/08/2014 0813   LDLCALC 55 09/28/2024 1131    Physical Exam:    VS:  BP 130/86 (BP Location: Left Arm, Patient Position: Sitting, Cuff Size: Normal)   Pulse 74   Ht 6' (1.829 m)   Wt 215 lb 9.6 oz (97.8 kg)   SpO2 94%   BMI 29.24 kg/m     Wt Readings from Last 3 Encounters:  09/28/24 215 lb 9.6 oz (97.8 kg)  09/15/23 209 lb 3.2 oz (94.9 kg)  03/10/23 199 lb 12.8 oz (90.6 kg)     GEN:  Well nourished, well developed in no acute distress HEENT: Normal NECK: No JVD; No carotid bruits LYMPHATICS: No lymphadenopathy CARDIAC: RRR, no murmurs, rubs, gallops RESPIRATORY:  Clear to auscultation without rales, wheezing or rhonchi  ABDOMEN: Soft, non-tender, non-distended MUSCULOSKELETAL:  No edema; No deformity  SKIN: Warm and dry NEUROLOGIC:  Alert and oriented x 3 PSYCHIATRIC:  Normal affect   ASSESSMENT:    1. Coronary artery disease  involving native coronary artery of native heart without angina pectoris   2. Essential hypertension   3. Hyperlipidemia, unspecified hyperlipidemia type    PLAN:    CAD: Cardiac catheterization in 2001 showed 30 to 40% mid RCA stenosis and 20% ostial PDA stenosis.  Normal perfusion on nuclear stress test in 2008.  ETT 10/2021 showed no evidence of ischemia.  Echocardiogram 09/2024 showed normal biventricular  function, no significant valvular disease. - Continue aspirin 81 mg daily - Continue rosuvastatin 5 mg daily  Hypertension: On amlodipine  10 mg daily and lisinopril  30 mg daily.  Appears controlled.  Check BMET  Hyperlipidemia: On rosuvastatin 5 mg daily.  Check lipid panel  RTC in 1 year    Medication Adjustments/Labs and Tests Ordered: Current medicines are reviewed at length with the patient today.  Concerns regarding medicines are outlined above.  Orders Placed This Encounter  Procedures   Basic Metabolic Panel (BMET)   Lipid panel   EKG 12-Lead   No orders of the defined types were placed in this encounter.   Patient Instructions  Medication Instructions:  Your physician recommends that you continue on your current medications as directed. Please refer to the Current Medication list given to you today.  *If you need a refill on your cardiac medications before your next appointment, please call your pharmacy*  Lab Work: Lipid, bmet If you have labs (blood work) drawn today and your tests are completely normal, you will receive your results only by: MyChart Message (if you have MyChart) OR A paper copy in the mail If you have any lab test that is abnormal or we need to change your treatment, we will call you to review the results.  Testing/Procedures: none  Follow-Up: At [facility], you and your health needs are our priority.  As part of our continuing mission to provide you with exceptional heart care, our providers are all part of one team.  This team  includes your primary Cardiologist (physician) and Advanced Practice Providers or APPs (Physician Assistants and Nurse Practitioners) who all work together to provide you with the care you need, when you need it.  Your next appointment:   1 year  Provider:   Dr. Kate  We recommend signing up for the patient portal called MyChart.  Sign up information is provided on this After Visit Summary.  MyChart is used to connect with patients for Virtual Visits (Telemedicine).  Patients are able to view lab/test results, encounter notes, upcoming appointments, etc.  Non-urgent messages can be sent to your provider as well.   To learn more about what you can do with MyChart, go to [email or web address].   Other Instructions none           Signed, Lonni LITTIE Kate, MD  09/29/2024 5:48 PM     [facility]

## 2024-09-28 ENCOUNTER — Encounter: Payer: Self-pay | Admitting: Cardiology

## 2024-09-28 ENCOUNTER — Ambulatory Visit: Attending: Internal Medicine | Admitting: Cardiology

## 2024-09-28 VITALS — BP 130/86 | HR 74 | Ht 72.0 in | Wt 215.6 lb

## 2024-09-28 DIAGNOSIS — E785 Hyperlipidemia, unspecified: Secondary | ICD-10-CM

## 2024-09-28 DIAGNOSIS — I10 Essential (primary) hypertension: Secondary | ICD-10-CM

## 2024-09-28 DIAGNOSIS — I2510 Atherosclerotic heart disease of native coronary artery without angina pectoris: Secondary | ICD-10-CM

## 2024-09-28 NOTE — Patient Instructions (Signed)
 Medication Instructions:  Your physician recommends that you continue on your current medications as directed. Please refer to the Current Medication list given to you today.  *If you need a refill on your cardiac medications before your next appointment, please call your pharmacy*  Lab Work: Lipid, bmet If you have labs (blood work) drawn today and your tests are completely normal, you will receive your results only by: MyChart Message (if you have MyChart) OR A paper copy in the mail If you have any lab test that is abnormal or we need to change your treatment, we will call you to review the results.  Testing/Procedures: none  Follow-Up: At [facility], you and your health needs are our priority.  As part of our continuing mission to provide you with exceptional heart care, our providers are all part of one team.  This team includes your primary Cardiologist (physician) and Advanced Practice Providers or APPs (Physician Assistants and Nurse Practitioners) who all work together to provide you with the care you need, when you need it.  Your next appointment:   1 year  Provider:   Dr. Kate  We recommend signing up for the patient portal called MyChart.  Sign up information is provided on this After Visit Summary.  MyChart is used to connect with patients for Virtual Visits (Telemedicine).  Patients are able to view lab/test results, encounter notes, upcoming appointments, etc.  Non-urgent messages can be sent to your provider as well.   To learn more about what you can do with MyChart, go to [email or web address].   Other Instructions none

## 2024-09-29 ENCOUNTER — Ambulatory Visit: Payer: Self-pay | Admitting: Cardiology

## 2024-09-29 LAB — BASIC METABOLIC PANEL WITH GFR
BUN/Creatinine Ratio: 20 (ref 10–24)
BUN: 18 mg/dL (ref 8–27)
CO2: 22 mmol/L (ref 20–29)
Calcium: 9.2 mg/dL (ref 8.6–10.2)
Chloride: 101 mmol/L (ref 96–106)
Creatinine, Ser: 0.88 mg/dL (ref 0.76–1.27)
Glucose: 91 mg/dL (ref 70–99)
Potassium: 4.7 mmol/L (ref 3.5–5.2)
Sodium: 138 mmol/L (ref 134–144)
eGFR: 93 mL/min/1.73 (ref >59)

## 2024-09-29 LAB — LIPID PANEL
Chol/HDL Ratio: 2.7 ratio (ref 0.0–5.0)
Cholesterol, Total: 121 mg/dL (ref 100–199)
HDL: 45 mg/dL (ref >39)
LDL Chol Calc (NIH): 55 mg/dL (ref 0–99)
Triglycerides: 119 mg/dL (ref 0–149)
VLDL Cholesterol Cal: 21 mg/dL (ref 5–40)
